# Patient Record
Sex: Female | Born: 1965 | Race: White | Hispanic: No | Marital: Married | Smoking: Never smoker
Health system: Southern US, Community
[De-identification: ages and names within clinical notes are randomized; demographics above are authoritative.]

## PROBLEM LIST (undated history)

## (undated) DIAGNOSIS — R55 Syncope and collapse: Secondary | ICD-10-CM

## (undated) DIAGNOSIS — L920 Granuloma annulare: Secondary | ICD-10-CM

## (undated) HISTORY — DX: Syncope and collapse: R55

## (undated) HISTORY — PX: TONSILLECTOMY: SUR1361

## (undated) HISTORY — DX: Granuloma annulare: L92.0

## (undated) HISTORY — PX: ABDOMINOPLASTY: SUR9

---
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
## 1992-07-16 HISTORY — PX: ABDOMINAL HYSTERECTOMY: SHX81

## 2015-03-27 ENCOUNTER — Encounter: Payer: Self-pay | Admitting: Family Medicine

## 2015-03-27 ENCOUNTER — Ambulatory Visit (INDEPENDENT_AMBULATORY_CARE_PROVIDER_SITE_OTHER): Payer: 59 | Admitting: Family Medicine

## 2015-03-27 ENCOUNTER — Other Ambulatory Visit: Payer: Self-pay | Admitting: Family Medicine

## 2015-03-27 ENCOUNTER — Ambulatory Visit: Payer: Self-pay | Admitting: Family Medicine

## 2015-03-27 VITALS — BP 100/66 | HR 69 | Temp 98.4°F | Resp 14 | Ht 63.5 in | Wt 201.4 lb

## 2015-03-27 DIAGNOSIS — R8290 Unspecified abnormal findings in urine: Secondary | ICD-10-CM

## 2015-03-27 DIAGNOSIS — Z1231 Encounter for screening mammogram for malignant neoplasm of breast: Secondary | ICD-10-CM

## 2015-03-27 DIAGNOSIS — Z0000 Encounter for general adult medical examination without abnormal findings: Secondary | ICD-10-CM

## 2015-03-27 LAB — POCT URINALYSIS DIPSTICK
Glucose, UA: NEGATIVE
Leukocytes, UA: NEGATIVE
NITRITE UA: NEGATIVE
RBC UA: NEGATIVE
UROBILINOGEN UA: 0.2
pH, UA: 6

## 2015-03-27 NOTE — Addendum Note (Signed)
Addended by: Eustace QuailEABOLD, Shariyah Eland J on: 03/27/2015 04:34 PM   Modules accepted: Orders

## 2015-03-27 NOTE — Patient Instructions (Signed)
Preventive Care for Adults, Female A healthy lifestyle and preventive care can promote health and wellness. Preventive health guidelines for women include the following key practices.  A routine yearly physical is a good way to check with your health care provider about your health and preventive screening. It is a chance to share any concerns and updates on your health and to receive a thorough exam.  Visit your dentist for a routine exam and preventive care every 6 months. Brush your teeth twice a day and floss once a day. Good oral hygiene prevents tooth decay and gum disease.  The frequency of eye exams is based on your age, health, family medical history, use of contact lenses, and other factors. Follow your health care provider's recommendations for frequency of eye exams.  Eat a healthy diet. Foods like vegetables, fruits, whole grains, low-fat dairy products, and lean protein foods contain the nutrients you need without too many calories. Decrease your intake of foods high in solid fats, added sugars, and salt. Eat the right amount of calories for you.Get information about a proper diet from your health care provider, if necessary.  Regular physical exercise is one of the most important things you can do for your health. Most adults should get at least 150 minutes of moderate-intensity exercise (any activity that increases your heart rate and causes you to sweat) each week. In addition, most adults need muscle-strengthening exercises on 2 or more days a week.  Maintain a healthy weight. The body mass index (BMI) is a screening tool to identify possible weight problems. It provides an estimate of body fat based on height and weight. Your health care provider can find your BMI and can help you achieve or maintain a healthy weight.For adults 20 years and older:  A BMI below 18.5 is considered underweight.  A BMI of 18.5 to 24.9 is normal.  A BMI of 25 to 29.9 is considered overweight.  A  BMI of 30 and above is considered obese.  Maintain normal blood lipids and cholesterol levels by exercising and minimizing your intake of saturated fat. Eat a balanced diet with plenty of fruit and vegetables. Blood tests for lipids and cholesterol should begin at age 45 and be repeated every 5 years. If your lipid or cholesterol levels are high, you are over 50, or you are at high risk for heart disease, you may need your cholesterol levels checked more frequently.Ongoing high lipid and cholesterol levels should be treated with medicines if diet and exercise are not working.  If you smoke, find out from your health care provider how to quit. If you do not use tobacco, do not start.  Lung cancer screening is recommended for adults aged 45-80 years who are at high risk for developing lung cancer because of a history of smoking. A yearly low-dose CT scan of the lungs is recommended for people who have at least a 30-pack-year history of smoking and are a current smoker or have quit within the past 15 years. A pack year of smoking is smoking an average of 1 pack of cigarettes a day for 1 year (for example: 1 pack a day for 30 years or 2 packs a day for 15 years). Yearly screening should continue until the smoker has stopped smoking for at least 15 years. Yearly screening should be stopped for people who develop a health problem that would prevent them from having lung cancer treatment.  If you are pregnant, do not drink alcohol. If you are  breastfeeding, be very cautious about drinking alcohol. If you are not pregnant and choose to drink alcohol, do not have more than 1 drink per day. One drink is considered to be 12 ounces (355 mL) of beer, 5 ounces (148 mL) of wine, or 1.5 ounces (44 mL) of liquor.  Avoid use of street drugs. Do not share needles with anyone. Ask for help if you need support or instructions about stopping the use of drugs.  High blood pressure causes heart disease and increases the risk  of stroke. Your blood pressure should be checked at least every 1 to 2 years. Ongoing high blood pressure should be treated with medicines if weight loss and exercise do not work.  If you are 55-79 years old, ask your health care provider if you should take aspirin to prevent strokes.  Diabetes screening is done by taking a blood sample to check your blood glucose level after you have not eaten for a certain period of time (fasting). If you are not overweight and you do not have risk factors for diabetes, you should be screened once every 3 years starting at age 45. If you are overweight or obese and you are 40-70 years of age, you should be screened for diabetes every year as part of your cardiovascular risk assessment.  Breast cancer screening is essential preventive care for women. You should practice "breast self-awareness." This means understanding the normal appearance and feel of your breasts and may include breast self-examination. Any changes detected, no matter how small, should be reported to a health care provider. Women in their 20s and 30s should have a clinical breast exam (CBE) by a health care provider as part of a regular health exam every 1 to 3 years. After age 40, women should have a CBE every year. Starting at age 40, women should consider having a mammogram (breast X-ray test) every year. Women who have a family history of breast cancer should talk to their health care provider about genetic screening. Women at a high risk of breast cancer should talk to their health care providers about having an MRI and a mammogram every year.  Breast cancer gene (BRCA)-related cancer risk assessment is recommended for women who have family members with BRCA-related cancers. BRCA-related cancers include breast, ovarian, tubal, and peritoneal cancers. Having family members with these cancers may be associated with an increased risk for harmful changes (mutations) in the breast cancer genes BRCA1 and  BRCA2. Results of the assessment will determine the need for genetic counseling and BRCA1 and BRCA2 testing.  Your health care provider may recommend that you be screened regularly for cancer of the pelvic organs (ovaries, uterus, and vagina). This screening involves a pelvic examination, including checking for microscopic changes to the surface of your cervix (Pap test). You may be encouraged to have this screening done every 3 years, beginning at age 21.  For women ages 30-65, health care providers may recommend pelvic exams and Pap testing every 3 years, or they may recommend the Pap and pelvic exam, combined with testing for human papilloma virus (HPV), every 5 years. Some types of HPV increase your risk of cervical cancer. Testing for HPV may also be done on women of any age with unclear Pap test results.  Other health care providers may not recommend any screening for nonpregnant women who are considered low risk for pelvic cancer and who do not have symptoms. Ask your health care provider if a screening pelvic exam is right for   you.  If you have had past treatment for cervical cancer or a condition that could lead to cancer, you need Pap tests and screening for cancer for at least 20 years after your treatment. If Pap tests have been discontinued, your risk factors (such as having a new sexual partner) need to be reassessed to determine if screening should resume. Some women have medical problems that increase the chance of getting cervical cancer. In these cases, your health care provider may recommend more frequent screening and Pap tests.  Colorectal cancer can be detected and often prevented. Most routine colorectal cancer screening begins at the age of 50 years and continues through age 75 years. However, your health care provider may recommend screening at an earlier age if you have risk factors for colon cancer. On a yearly basis, your health care provider may provide home test kits to check  for hidden blood in the stool. Use of a small camera at the end of a tube, to directly examine the colon (sigmoidoscopy or colonoscopy), can detect the earliest forms of colorectal cancer. Talk to your health care provider about this at age 50, when routine screening begins. Direct exam of the colon should be repeated every 5-10 years through age 75 years, unless early forms of precancerous polyps or small growths are found.  People who are at an increased risk for hepatitis B should be screened for this virus. You are considered at high risk for hepatitis B if:  You were born in a country where hepatitis B occurs often. Talk with your health care provider about which countries are considered high risk.  Your parents were born in a high-risk country and you have not received a shot to protect against hepatitis B (hepatitis B vaccine).  You have HIV or AIDS.  You use needles to inject street drugs.  You live with, or have sex with, someone who has hepatitis B.  You get hemodialysis treatment.  You take certain medicines for conditions like cancer, organ transplantation, and autoimmune conditions.  Hepatitis C blood testing is recommended for all people born from 1945 through 1965 and any individual with known risks for hepatitis C.  Practice safe sex. Use condoms and avoid high-risk sexual practices to reduce the spread of sexually transmitted infections (STIs). STIs include gonorrhea, chlamydia, syphilis, trichomonas, herpes, HPV, and human immunodeficiency virus (HIV). Herpes, HIV, and HPV are viral illnesses that have no cure. They can result in disability, cancer, and death.  You should be screened for sexually transmitted illnesses (STIs) including gonorrhea and chlamydia if:  You are sexually active and are younger than 24 years.  You are older than 24 years and your health care provider tells you that you are at risk for this type of infection.  Your sexual activity has changed  since you were last screened and you are at an increased risk for chlamydia or gonorrhea. Ask your health care provider if you are at risk.  If you are at risk of being infected with HIV, it is recommended that you take a prescription medicine daily to prevent HIV infection. This is called preexposure prophylaxis (PrEP). You are considered at risk if:  You are sexually active and do not regularly use condoms or know the HIV status of your partner(s).  You take drugs by injection.  You are sexually active with a partner who has HIV.  Talk with your health care provider about whether you are at high risk of being infected with HIV. If   you choose to begin PrEP, you should first be tested for HIV. You should then be tested every 3 months for as long as you are taking PrEP.  Osteoporosis is a disease in which the bones lose minerals and strength with aging. This can result in serious bone fractures or breaks. The risk of osteoporosis can be identified using a bone density scan. Women ages 67 years and over and women at risk for fractures or osteoporosis should discuss screening with their health care providers. Ask your health care provider whether you should take a calcium supplement or vitamin D to reduce the rate of osteoporosis.  Menopause can be associated with physical symptoms and risks. Hormone replacement therapy is available to decrease symptoms and risks. You should talk to your health care provider about whether hormone replacement therapy is right for you.  Use sunscreen. Apply sunscreen liberally and repeatedly throughout the day. You should seek shade when your shadow is shorter than you. Protect yourself by wearing long sleeves, pants, a wide-brimmed hat, and sunglasses year round, whenever you are outdoors.  Once a month, do a whole body skin exam, using a mirror to look at the skin on your back. Tell your health care provider of new moles, moles that have irregular borders, moles that  are larger than a pencil eraser, or moles that have changed in shape or color.  Stay current with required vaccines (immunizations).  Influenza vaccine. All adults should be immunized every year.  Tetanus, diphtheria, and acellular pertussis (Td, Tdap) vaccine. Pregnant women should receive 1 dose of Tdap vaccine during each pregnancy. The dose should be obtained regardless of the length of time since the last dose. Immunization is preferred during the 27th-36th week of gestation. An adult who has not previously received Tdap or who does not know her vaccine status should receive 1 dose of Tdap. This initial dose should be followed by tetanus and diphtheria toxoids (Td) booster doses every 10 years. Adults with an unknown or incomplete history of completing a 3-dose immunization series with Td-containing vaccines should begin or complete a primary immunization series including a Tdap dose. Adults should receive a Td booster every 10 years.  Varicella vaccine. An adult without evidence of immunity to varicella should receive 2 doses or a second dose if she has previously received 1 dose. Pregnant females who do not have evidence of immunity should receive the first dose after pregnancy. This first dose should be obtained before leaving the health care facility. The second dose should be obtained 4-8 weeks after the first dose.  Human papillomavirus (HPV) vaccine. Females aged 13-26 years who have not received the vaccine previously should obtain the 3-dose series. The vaccine is not recommended for use in pregnant females. However, pregnancy testing is not needed before receiving a dose. If a female is found to be pregnant after receiving a dose, no treatment is needed. In that case, the remaining doses should be delayed until after the pregnancy. Immunization is recommended for any person with an immunocompromised condition through the age of 61 years if she did not get any or all doses earlier. During the  3-dose series, the second dose should be obtained 4-8 weeks after the first dose. The third dose should be obtained 24 weeks after the first dose and 16 weeks after the second dose.  Zoster vaccine. One dose is recommended for adults aged 30 years or older unless certain conditions are present.  Measles, mumps, and rubella (MMR) vaccine. Adults born  before 1957 generally are considered immune to measles and mumps. Adults born in 1957 or later should have 1 or more doses of MMR vaccine unless there is a contraindication to the vaccine or there is laboratory evidence of immunity to each of the three diseases. A routine second dose of MMR vaccine should be obtained at least 28 days after the first dose for students attending postsecondary schools, health care workers, or international travelers. People who received inactivated measles vaccine or an unknown type of measles vaccine during 1963-1967 should receive 2 doses of MMR vaccine. People who received inactivated mumps vaccine or an unknown type of mumps vaccine before 1979 and are at high risk for mumps infection should consider immunization with 2 doses of MMR vaccine. For females of childbearing age, rubella immunity should be determined. If there is no evidence of immunity, females who are not pregnant should be vaccinated. If there is no evidence of immunity, females who are pregnant should delay immunization until after pregnancy. Unvaccinated health care workers born before 1957 who lack laboratory evidence of measles, mumps, or rubella immunity or laboratory confirmation of disease should consider measles and mumps immunization with 2 doses of MMR vaccine or rubella immunization with 1 dose of MMR vaccine.  Pneumococcal 13-valent conjugate (PCV13) vaccine. When indicated, a person who is uncertain of his immunization history and has no record of immunization should receive the PCV13 vaccine. All adults 65 years of age and older should receive this  vaccine. An adult aged 19 years or older who has certain medical conditions and has not been previously immunized should receive 1 dose of PCV13 vaccine. This PCV13 should be followed with a dose of pneumococcal polysaccharide (PPSV23) vaccine. Adults who are at high risk for pneumococcal disease should obtain the PPSV23 vaccine at least 8 weeks after the dose of PCV13 vaccine. Adults older than 52 years of age who have normal immune system function should obtain the PPSV23 vaccine dose at least 1 year after the dose of PCV13 vaccine.  Pneumococcal polysaccharide (PPSV23) vaccine. When PCV13 is also indicated, PCV13 should be obtained first. All adults aged 65 years and older should be immunized. An adult younger than age 65 years who has certain medical conditions should be immunized. Any person who resides in a nursing home or long-term care facility should be immunized. An adult smoker should be immunized. People with an immunocompromised condition and certain other conditions should receive both PCV13 and PPSV23 vaccines. People with human immunodeficiency virus (HIV) infection should be immunized as soon as possible after diagnosis. Immunization during chemotherapy or radiation therapy should be avoided. Routine use of PPSV23 vaccine is not recommended for American Indians, [place] Natives, or people younger than 65 years unless there are medical conditions that require PPSV23 vaccine. When indicated, people who have unknown immunization and have no record of immunization should receive PPSV23 vaccine. One-time revaccination 5 years after the first dose of PPSV23 is recommended for people aged 19-64 years who have chronic kidney failure, nephrotic syndrome, asplenia, or immunocompromised conditions. People who received 1-2 doses of PPSV23 before age 65 years should receive another dose of PPSV23 vaccine at age 65 years or later if at least 5 years have passed since the previous dose. Doses of PPSV23 are not  needed for people immunized with PPSV23 at or after age 65 years.  Meningococcal vaccine. Adults with asplenia or persistent complement component deficiencies should receive 2 doses of quadrivalent meningococcal conjugate (MenACWY-D) vaccine. The doses should be obtained   at least 2 months apart. Microbiologists working with certain meningococcal bacteria, [place] recruits, people at risk during an outbreak, and people who travel to or live in countries with a high rate of meningitis should be immunized. A first-year college student up through age 34 years who is living in a residence hall should receive a dose if she did not receive a dose on or after her 16th birthday. Adults who have certain high-risk conditions should receive one or more doses of vaccine.  Hepatitis A vaccine. Adults who wish to be protected from this disease, have certain high-risk conditions, work with hepatitis A-infected animals, work in hepatitis A research labs, or travel to or work in countries with a high rate of hepatitis A should be immunized. Adults who were previously unvaccinated and who anticipate close contact with an international adoptee during the first 60 days after arrival in the [place] from a country with a high rate of hepatitis A should be immunized.  Hepatitis B vaccine. Adults who wish to be protected from this disease, have certain high-risk conditions, may be exposed to blood or other infectious body fluids, are household contacts or sex partners of hepatitis B positive people, are clients or workers in certain care facilities, or travel to or work in countries with a high rate of hepatitis B should be immunized.  Haemophilus influenzae type b (Hib) vaccine. A previously unvaccinated person with asplenia or sickle cell disease or having a scheduled splenectomy should receive 1 dose of Hib vaccine. Regardless of previous immunization, a recipient of a hematopoietic stem cell transplant should receive a  3-dose series 6-12 months after her successful transplant. Hib vaccine is not recommended for adults with HIV infection. Preventive Services / Frequency Ages 35 to 4 years  Blood pressure check.** / Every 3-5 years.  Lipid and cholesterol check.** / Every 5 years beginning at age 60.  Clinical breast exam.** / Every 3 years for women in their 71s and 10s.  BRCA-related cancer risk assessment.** / For women who have family members with a BRCA-related cancer (breast, ovarian, tubal, or peritoneal cancers).  Pap test.** / Every 2 years from ages 76 through 26. Every 3 years starting at age 61 through age 76 or 93 with a history of 3 consecutive normal Pap tests.  HPV screening.** / Every 3 years from ages 37 through ages 60 to 51 with a history of 3 consecutive normal Pap tests.  Hepatitis C blood test.** / For any individual with known risks for hepatitis C.  Skin self-exam. / Monthly.  Influenza vaccine. / Every year.  Tetanus, diphtheria, and acellular pertussis (Tdap, Td) vaccine.** / Consult your health care provider. Pregnant women should receive 1 dose of Tdap vaccine during each pregnancy. 1 dose of Td every 10 years.  Varicella vaccine.** / Consult your health care provider. Pregnant females who do not have evidence of immunity should receive the first dose after pregnancy.  HPV vaccine. / 3 doses over 6 months, if 93 and younger. The vaccine is not recommended for use in pregnant females. However, pregnancy testing is not needed before receiving a dose.  Measles, mumps, rubella (MMR) vaccine.** / You need at least 1 dose of MMR if you were born in 1957 or later. You may also need a 2nd dose. For females of childbearing age, rubella immunity should be determined. If there is no evidence of immunity, females who are not pregnant should be vaccinated. If there is no evidence of immunity, females who are  pregnant should delay immunization until after pregnancy.  Pneumococcal  13-valent conjugate (PCV13) vaccine.** / Consult your health care provider.  Pneumococcal polysaccharide (PPSV23) vaccine.** / 1 to 2 doses if you smoke cigarettes or if you have certain conditions.  Meningococcal vaccine.** / 1 dose if you are age 68 to 8 years and a Market researcher living in a residence hall, or have one of several medical conditions, you need to get vaccinated against meningococcal disease. You may also need additional booster doses.  Hepatitis A vaccine.** / Consult your health care provider.  Hepatitis B vaccine.** / Consult your health care provider.  Haemophilus influenzae type b (Hib) vaccine.** / Consult your health care provider. Ages 7 to 53 years  Blood pressure check.** / Every year.  Lipid and cholesterol check.** / Every 5 years beginning at age 25 years.  Lung cancer screening. / Every year if you are aged 11-80 years and have a 30-pack-year history of smoking and currently smoke or have quit within the past 15 years. Yearly screening is stopped once you have quit smoking for at least 15 years or develop a health problem that would prevent you from having lung cancer treatment.  Clinical breast exam.** / Every year after age 48 years.  BRCA-related cancer risk assessment.** / For women who have family members with a BRCA-related cancer (breast, ovarian, tubal, or peritoneal cancers).  Mammogram.** / Every year beginning at age 41 years and continuing for as long as you are in good health. Consult with your health care provider.  Pap test.** / Every 3 years starting at age 65 years through age 37 or 70 years with a history of 3 consecutive normal Pap tests.  HPV screening.** / Every 3 years from ages 72 years through ages 60 to 40 years with a history of 3 consecutive normal Pap tests.  Fecal occult blood test (FOBT) of stool. / Every year beginning at age 21 years and continuing until age 5 years. You may not need to do this test if you get  a colonoscopy every 10 years.  Flexible sigmoidoscopy or colonoscopy.** / Every 5 years for a flexible sigmoidoscopy or every 10 years for a colonoscopy beginning at age 35 years and continuing until age 48 years.  Hepatitis C blood test.** / For all people born from [birth year] through 1965 and any individual with known risks for hepatitis C.  Skin self-exam. / Monthly.  Influenza vaccine. / Every year.  Tetanus, diphtheria, and acellular pertussis (Tdap/Td) vaccine.** / Consult your health care provider. Pregnant women should receive 1 dose of Tdap vaccine during each pregnancy. 1 dose of Td every 10 years.  Varicella vaccine.** / Consult your health care provider. Pregnant females who do not have evidence of immunity should receive the first dose after pregnancy.  Zoster vaccine.** / 1 dose for adults aged 30 years or older.  Measles, mumps, rubella (MMR) vaccine.** / You need at least 1 dose of MMR if you were born in 1957 or later. You may also need a second dose. For females of childbearing age, rubella immunity should be determined. If there is no evidence of immunity, females who are not pregnant should be vaccinated. If there is no evidence of immunity, females who are pregnant should delay immunization until after pregnancy.  Pneumococcal 13-valent conjugate (PCV13) vaccine.** / Consult your health care provider.  Pneumococcal polysaccharide (PPSV23) vaccine.** / 1 to 2 doses if you smoke cigarettes or if you have certain conditions.  Meningococcal vaccine.** /  Consult your health care provider.  Hepatitis A vaccine.** / Consult your health care provider.  Hepatitis B vaccine.** / Consult your health care provider.  Haemophilus influenzae type b (Hib) vaccine.** / Consult your health care provider. Ages 64 years and over  Blood pressure check.** / Every year.  Lipid and cholesterol check.** / Every 5 years beginning at age 23 years.  Lung cancer screening. / Every year if you  are aged 16-80 years and have a 30-pack-year history of smoking and currently smoke or have quit within the past 15 years. Yearly screening is stopped once you have quit smoking for at least 15 years or develop a health problem that would prevent you from having lung cancer treatment.  Clinical breast exam.** / Every year after age 74 years.  BRCA-related cancer risk assessment.** / For women who have family members with a BRCA-related cancer (breast, ovarian, tubal, or peritoneal cancers).  Mammogram.** / Every year beginning at age 44 years and continuing for as long as you are in good health. Consult with your health care provider.  Pap test.** / Every 3 years starting at age 58 years through age 22 or 39 years with 3 consecutive normal Pap tests. Testing can be stopped between 65 and 70 years with 3 consecutive normal Pap tests and no abnormal Pap or HPV tests in the past 10 years.  HPV screening.** / Every 3 years from ages 64 years through ages 70 or 61 years with a history of 3 consecutive normal Pap tests. Testing can be stopped between 65 and 70 years with 3 consecutive normal Pap tests and no abnormal Pap or HPV tests in the past 10 years.  Fecal occult blood test (FOBT) of stool. / Every year beginning at age 40 years and continuing until age 27 years. You may not need to do this test if you get a colonoscopy every 10 years.  Flexible sigmoidoscopy or colonoscopy.** / Every 5 years for a flexible sigmoidoscopy or every 10 years for a colonoscopy beginning at age 7 years and continuing until age 32 years.  Hepatitis C blood test.** / For all people born from [birth year] through 1965 and any individual with known risks for hepatitis C.  Osteoporosis screening.** / A one-time screening for women ages 30 years and over and women at risk for fractures or osteoporosis.  Skin self-exam. / Monthly.  Influenza vaccine. / Every year.  Tetanus, diphtheria, and acellular pertussis (Tdap/Td)  vaccine.** / 1 dose of Td every 10 years.  Varicella vaccine.** / Consult your health care provider.  Zoster vaccine.** / 1 dose for adults aged 35 years or older.  Pneumococcal 13-valent conjugate (PCV13) vaccine.** / Consult your health care provider.  Pneumococcal polysaccharide (PPSV23) vaccine.** / 1 dose for all adults aged 46 years and older.  Meningococcal vaccine.** / Consult your health care provider.  Hepatitis A vaccine.** / Consult your health care provider.  Hepatitis B vaccine.** / Consult your health care provider.  Haemophilus influenzae type b (Hib) vaccine.** / Consult your health care provider. ** Family history and personal history of risk and conditions may change your health care provider's recommendations.   This information is not intended to replace advice given to you by your health care provider. Make sure you discuss any questions you have with your health care provider.   Document Released: 04/30/2001 Document Revised: 03/25/2014 Document Reviewed: 07/30/2010 Elsevier Interactive Patient Education [employer].

## 2015-03-27 NOTE — Progress Notes (Signed)
Subjective:     Julie Gibbs is a 50 y.o. female and is here for a comprehensive physical exam. The patient reports no problems.  Social History   Social History  . Marital Status: Married    Spouse Name: N/A  . Number of Children: N/A  . Years of Education: N/A   Occupational History  . Not on file.   Social History Main Topics  . Smoking status: Never Smoker   . Smokeless tobacco: Not on file  . Alcohol Use: 0.6 - 1.2 oz/week    1-2 Glasses of wine per week  . Drug Use: Not on file  . Sexual Activity: Yes   Other Topics Concern  . Not on file   Social History Narrative  . No narrative on file   Health Maintenance  Topic Date Due  . Hepatitis C Screening  1966/02/01  . HIV Screening  09/05/1978  . TETANUS/TDAP  09/05/1982  . COLONOSCOPY  09/04/2013  . INFLUENZA VACCINE  06/16/2015 (Originally 10/17/2014)  . MAMMOGRAM  09/24/2015  . PAP SMEAR  09/23/2016    The following portions of the patient's history were reviewed and updated as appropriate:  She  has a past medical history of Granuloma annulare. She  does not have a problem list on file. She  has past surgical history that includes Abdominal hysterectomy (07/1992); Tonsillectomy (Kindergarten); and Abdominoplasty ("About 5 years ago"). Her family history includes Ataxia in her mother; Cancer in her father; Coronary artery disease in her paternal grandfather; Heart disease in her paternal grandmother; Heart failure in her paternal grandmother; Mental retardation in her maternal grandfather; Other in her mother; Suicidality in her paternal grandfather. She  reports that she has never smoked. She does not have any smokeless tobacco history on file. She reports that she drinks about 0.6 - 1.2 oz of alcohol per week. Her drug history is not on file. She has a current medication list which includes the following prescription(s): vitamin d3, clobetasol ointment, NON FORMULARY, and triamcinolone cream. No current outpatient  prescriptions on file prior to visit.   No current facility-administered medications on file prior to visit.   She has No Known Allergies..  Review of Systems Review of Systems  Constitutional: Negative for activity change, appetite change and fatigue.  HENT: Negative for hearing loss, congestion, tinnitus and ear discharge.  dentist q40mEyes: Negative for visual disturbance (see optho q1y -- vision corrected to 20/20 with glasses).  Respiratory: Negative for cough, chest tightness and shortness of breath.   Cardiovascular: Negative for chest pain, palpitations and leg swelling.  Gastrointestinal: Negative for abdominal pain, diarrhea, constipation and abdominal distention.  Genitourinary: Negative for urgency, frequency, decreased urine volume and difficulty urinating.  Musculoskeletal: Negative for back pain, arthralgias and gait problem.  Skin: Negative for color change, pallor and rash.  Neurological: Negative for dizziness, light-headedness, numbness and headaches.  Hematological: Negative for adenopathy. Does not bruise/bleed easily.  Psychiatric/Behavioral: Negative for suicidal ideas, confusion, sleep disturbance, self-injury, dysphoric mood, decreased concentration and agitation.       Objective:    BP 100/66 mmHg  Pulse 69  Temp(Src) 98.4 F (36.9 C) (Oral)  Resp 14  Ht 5' 3.5" (1.613 m)  Wt 201 lb 6.4 oz (91.354 kg)  BMI 35.11 kg/m2  SpO2 99% General appearance: alert, cooperative, appears stated age and no distress Head: Normocephalic, without obvious abnormality, atraumatic Eyes: conjunctivae/corneas clear. PERRL, EOM's intact. Fundi benign. Ears: normal TM's and external ear canals both ears Nose: Nares normal.  Septum midline. Mucosa normal. No drainage or sinus tenderness. Throat: lips, mucosa, and tongue normal; teeth and gums normal Neck: no adenopathy, no carotid bruit, supple, symmetrical, trachea midline and thyroid not enlarged, symmetric, no  tenderness/mass/nodules Back: symmetric, no curvature. ROM normal. No CVA tenderness. Lungs: clear to auscultation bilaterally Breasts: normal appearance, no masses or tenderness Heart: regular rate and rhythm, S1, S2 normal, no murmur, click, rub or gallop Abdomen: soft, non-tender; bowel sounds normal; no masses,  no organomegaly Pelvic: deferred Extremities: extremities normal, atraumatic, no cyanosis or edema Pulses: 2+ and symmetric Skin: Skin color, texture, turgor normal. No rashes or lesions Lymph nodes: Cervical, supraclavicular, and axillary nodes normal. Neurologic: Alert and oriented X 3, normal strength and tone. Normal symmetric reflexes. Normal coordination and gait Psych- no depression, no anxiety      Assessment:    Healthy female exam.      Plan:    ghm utd Check labs See After Visit Summary for Counseling Recommendations   1. Preventative health care   - Ambulatory referral to Gastroenterology - Comp Met (CMET) - CBC with Differential/Platelet - Lipid panel - POCT urinalysis dipstick - TSH - HIV antibody - Hepatitis C antibody

## 2015-03-27 NOTE — Progress Notes (Signed)
Pre visit review using our clinic review tool, if applicable. No additional management support is needed unless otherwise documented below in the visit note. 

## 2015-03-27 NOTE — Progress Notes (Deleted)
   Subjective:    Patient ID: Julie Gibbs, female    DOB: 10-Dec-1965, 50 y.o.   MRN: [id number]  HPI    Review of Systems     Objective:   Physical Exam        Assessment & Plan:

## 2015-03-28 ENCOUNTER — Encounter: Payer: Self-pay | Admitting: Family Medicine

## 2015-03-28 ENCOUNTER — Ambulatory Visit (HOSPITAL_BASED_OUTPATIENT_CLINIC_OR_DEPARTMENT_OTHER)
Admission: RE | Admit: 2015-03-28 | Discharge: 2015-03-28 | Disposition: A | Discharge status: Home / Self Care | Payer: 59 | Source: Ambulatory Visit | Attending: Family Medicine | Admitting: Family Medicine

## 2015-03-28 DIAGNOSIS — Z1231 Encounter for screening mammogram for malignant neoplasm of breast: Secondary | ICD-10-CM | POA: Diagnosis present

## 2015-03-28 LAB — COMPREHENSIVE METABOLIC PANEL
ALBUMIN: 4.5 g/dL (ref 3.5–5.2)
ALT: 14 U/L (ref 0–35)
AST: 16 U/L (ref 0–37)
Alkaline Phosphatase: 66 U/L (ref 39–117)
BUN: 19 mg/dL (ref 6–23)
CHLORIDE: 103 meq/L (ref 96–112)
CO2: 26 meq/L (ref 19–32)
CREATININE: 0.92 mg/dL (ref 0.40–1.20)
Calcium: 8.9 mg/dL (ref 8.4–10.5)
GFR: 68.25 mL/min (ref >60.00)
Glucose, Bld: 84 mg/dL (ref 70–99)
POTASSIUM: 4.3 meq/L (ref 3.5–5.1)
SODIUM: 138 meq/L (ref 135–145)
Total Bilirubin: 0.5 mg/dL (ref 0.2–1.2)
Total Protein: 7.3 g/dL (ref 6.0–8.3)

## 2015-03-28 LAB — LIPID PANEL
CHOLESTEROL: 182 mg/dL (ref 0–200)
HDL: 50 mg/dL (ref >39.00)
LDL CALC: 111 mg/dL — AB (ref 0–99)
NonHDL: 132.41
TRIGLYCERIDES: 105 mg/dL (ref 0.0–149.0)
Total CHOL/HDL Ratio: 4
VLDL: 21 mg/dL (ref 0.0–40.0)

## 2015-03-28 LAB — CBC WITH DIFFERENTIAL/PLATELET
Basophils Absolute: 0 10*3/uL (ref 0.0–0.1)
Basophils Relative: 0.5 % (ref 0.0–3.0)
EOS ABS: 0.1 10*3/uL (ref 0.0–0.7)
EOS PCT: 0.8 % (ref 0.0–5.0)
HCT: 39.2 % (ref 36.0–46.0)
HEMOGLOBIN: 13 g/dL (ref 12.0–15.0)
LYMPHS PCT: 31.6 % (ref 12.0–46.0)
Lymphs Abs: 2.8 10*3/uL (ref 0.7–4.0)
MCHC: 33.2 g/dL (ref 30.0–36.0)
MCV: 95.6 fl (ref 78.0–100.0)
MONO ABS: 0.4 10*3/uL (ref 0.1–1.0)
Monocytes Relative: 4.5 % (ref 3.0–12.0)
Neutro Abs: 5.6 10*3/uL (ref 1.4–7.7)
Neutrophils Relative %: 62.6 % (ref 43.0–77.0)
Platelets: 357 10*3/uL (ref 150.0–400.0)
RBC: 4.11 Mil/uL (ref 3.87–5.11)
RDW: 12.3 % (ref 11.5–15.5)
WBC: 9 10*3/uL (ref 4.0–10.5)

## 2015-03-28 LAB — HEPATITIS C ANTIBODY: HCV AB: NEGATIVE

## 2015-03-28 LAB — TSH: TSH: 1.53 u[IU]/mL (ref 0.35–4.50)

## 2015-03-28 LAB — HIV ANTIBODY (ROUTINE TESTING W REFLEX): HIV 1&2 Ab, 4th Generation: NONREACTIVE

## 2015-03-29 LAB — URINE CULTURE: Colony Count: 15000

## 2015-03-30 ENCOUNTER — Encounter: Payer: Self-pay | Admitting: Gastroenterology

## 2015-04-04 ENCOUNTER — Encounter: Payer: Self-pay | Admitting: Family Medicine

## 2015-05-26 ENCOUNTER — Encounter: Payer: 59 | Admitting: Gastroenterology

## 2015-06-13 ENCOUNTER — Ambulatory Visit (AMBULATORY_SURGERY_CENTER): Payer: Self-pay

## 2015-06-13 VITALS — Ht 62.0 in | Wt 201.0 lb

## 2015-06-13 DIAGNOSIS — Z1211 Encounter for screening for malignant neoplasm of colon: Secondary | ICD-10-CM

## 2015-06-13 MED ORDER — NA SULFATE-K SULFATE-MG SULF 17.5-3.13-1.6 GM/177ML PO SOLN: ORAL | Status: DC

## 2015-06-13 NOTE — Progress Notes (Signed)
Per pt, no allergies to soy or egg products.Pt not taking any weight loss meds or using  O2 at home. 

## 2015-06-27 ENCOUNTER — Ambulatory Visit (AMBULATORY_SURGERY_CENTER): Payer: 59 | Admitting: Gastroenterology

## 2015-06-27 ENCOUNTER — Encounter: Payer: Self-pay | Admitting: Gastroenterology

## 2015-06-27 VITALS — BP 108/69 | HR 58 | Temp 98.6°F | Resp 12 | Ht 62.0 in | Wt 201.0 lb

## 2015-06-27 DIAGNOSIS — Z1211 Encounter for screening for malignant neoplasm of colon: Secondary | ICD-10-CM

## 2015-06-27 MED ORDER — SODIUM CHLORIDE 0.9 % IV SOLN
500.0000 mL | INTRAVENOUS | Status: DC
Start: 2015-06-27 — End: 2015-06-27

## 2015-06-27 NOTE — Op Note (Signed)
[facility] Patient Name: Julie Gibbs Procedure Date: 06/27/2015 8:43 AM MRN: [id number] Endoscopist: Napoleon Form , MD Age: 50 Date of Birth: 08-15-1965 Gender: Female Procedure:                Colonoscopy Indications:              Screening for colorectal malignant neoplasm Medicines:                Monitored Anesthesia Care Procedure:                Pre-Anesthesia Assessment:                           - Prior to the procedure, a History and Physical                            was performed, and patient medications and                            allergies were reviewed. The patient's tolerance of                            previous anesthesia was also reviewed. The risks                            and benefits of the procedure and the sedation                            options and risks were discussed with the patient.                            All questions were answered, and informed consent                            was obtained. Prior Anticoagulants: The patient has                            taken no previous anticoagulant or antiplatelet                            agents. ASA Grade Assessment: II - A patient with                            mild systemic disease. After reviewing the risks                            and benefits, the patient was deemed in                            satisfactory condition to undergo the procedure.                           After obtaining informed consent, the colonoscope  was passed under direct vision. Throughout the                            procedure, the patient's blood pressure, pulse, and                            oxygen saturations were monitored continuously. The                            Model CF-HQ190L [id number]) scope was introduced                            through the anus and advanced to the the terminal                            ileum, with identification of the appendiceal                          orifice and IC valve. The colonoscopy was performed                            without difficulty. The patient tolerated the                            procedure well. The quality of the bowel                            preparation was excellent. The terminal ileum,                            ileocecal valve, appendiceal orifice, and rectum                            were photographed. The quality of the bowel                            preparation was evaluated using the BBPS [facility]                            Bowel Preparation Scale) with scores of: Right                            Colon = 3, Transverse Colon = 3 and Left Colon = 3                            (entire mucosa seen well with no residual staining,                            small fragments of stool or opaque liquid). The                            total BBPS score equals 9. Scope In: 8:52:28 AM Scope Out: 9:06:15 AM Scope Withdrawal Time: 0 hours 4 minutes  35 seconds  Total Procedure Duration: 0 hours 13 minutes 47 seconds  Findings:                 The perianal and digital rectal examinations were                            normal.                           The colon (entire examined portion) appeared normal. Complications:            No immediate complications. Estimated Blood Loss:     Estimated blood loss: none. Impression:               - The entire examined colon is normal.                           - No specimens collected. Recommendation:           - Patient has a contact number available for                            emergencies. The signs and symptoms of potential                            delayed complications were discussed with the                            patient. Return to normal activities tomorrow.                            Written discharge instructions were provided to the                            patient.                           - Resume previous diet.                            - Continue present medications.                           - Repeat colonoscopy in 10 years for screening                            purposes.                           - Return to GI clinic PRN. Napoleon FormKavitha V. Jarryn Altland, MD 06/27/2015 9:09:43 AM This report has been signed electronically.

## 2015-06-27 NOTE — Patient Instructions (Signed)
YOU HAD AN ENDOSCOPIC PROCEDURE TODAY AT THE [facility]:   Refer to the procedure report that was given to you for any specific questions about what was found during the examination.  If the procedure report does not answer your questions, please call your gastroenterologist to clarify.  If you requested that your care partner not be given the details of your procedure findings, then the procedure report has been included in a sealed envelope for you to review at your convenience later.  YOU SHOULD EXPECT: Some feelings of bloating in the abdomen. Passage of more gas than usual.  Walking can help get rid of the air that was put into your GI tract during the procedure and reduce the bloating. If you had a lower endoscopy (such as a colonoscopy or flexible sigmoidoscopy) you may notice spotting of blood in your stool or on the toilet paper. If you underwent a bowel prep for your procedure, you may not have a normal bowel movement for a few days.  Please Note:  You might notice some irritation and congestion in your nose or some drainage.  This is from the oxygen used during your procedure.  There is no need for concern and it should clear up in a day or so.  SYMPTOMS TO REPORT IMMEDIATELY:   Following lower endoscopy (colonoscopy or flexible sigmoidoscopy):  Excessive amounts of blood in the stool  Significant tenderness or worsening of abdominal pains  Swelling of the abdomen that is new, acute  Fever of 100F or higher    For urgent or emergent issues, a gastroenterologist can be reached at any hour by calling [phone].   DIET: Your first meal following the procedure should be a small meal and then it is ok to progress to your normal diet. Heavy or fried foods are harder to digest and may make you feel nauseous or bloated.  Likewise, meals heavy in dairy and vegetables can increase bloating.  Drink plenty of fluids but you should avoid alcoholic beverages for 24  hours.  ACTIVITY:  You should plan to take it easy for the rest of today and you should NOT DRIVE or use heavy machinery until tomorrow (because of the sedation medicines used during the test).    FOLLOW UP: Our staff will call the number listed on your records the next business day following your procedure to check on you and address any questions or concerns that you may have regarding the information given to you following your procedure. If we do not reach you, we will leave a message.  However, if you are feeling well and you are not experiencing any problems, there is no need to return our call.  We will assume that you have returned to your regular daily activities without incident.  If any biopsies were taken you will be contacted by phone or by letter within the next 1-3 weeks.  Please call us at [phone] if you have not heard about the biopsies in 3 weeks.    SIGNATURES/CONFIDENTIALITY: You and/or your care partner have signed paperwork which will be entered into your electronic medical record.  These signatures attest to the fact that that the information above on your After Visit Summary has been reviewed and is understood.  Full responsibility of the confidentiality of this discharge information lies with you and/or your care-partner.   Resume medications. Information given on hemorrhoid banding.

## 2015-06-27 NOTE — Progress Notes (Signed)
Patient awakening,vss,report to rn 

## 2015-06-28 ENCOUNTER — Telehealth: Payer: Self-pay

## 2015-06-28 NOTE — Telephone Encounter (Signed)
  Follow up Call-  Call back number 06/27/2015  Post procedure Call Back phone  # [phone]  Permission to leave phone message Yes     Patient questions:  Do you have a fever, pain , or abdominal swelling? No. Pain Score  0 *  Have you tolerated food without any problems? Yes.    Have you been able to return to your normal activities? Yes.    Do you have any questions about your discharge instructions: Diet   No. Medications  No. Follow up visit  No.  Do you have questions or concerns about your Care? No.  Actions: * If pain score is 4 or above: No action needed, pain <4.

## 2016-03-21 ENCOUNTER — Other Ambulatory Visit: Payer: Self-pay | Admitting: Family Medicine

## 2016-03-21 DIAGNOSIS — Z1231 Encounter for screening mammogram for malignant neoplasm of breast: Secondary | ICD-10-CM

## 2016-03-28 ENCOUNTER — Ambulatory Visit (HOSPITAL_BASED_OUTPATIENT_CLINIC_OR_DEPARTMENT_OTHER)
Admission: RE | Admit: 2016-03-28 | Discharge: 2016-03-28 | Disposition: A | Discharge status: Home / Self Care | Payer: 59 | Source: Ambulatory Visit | Attending: Family Medicine | Admitting: Family Medicine

## 2016-03-28 DIAGNOSIS — Z1231 Encounter for screening mammogram for malignant neoplasm of breast: Secondary | ICD-10-CM

## 2016-04-01 ENCOUNTER — Other Ambulatory Visit (HOSPITAL_COMMUNITY)
Admission: RE | Admit: 2016-04-01 | Discharge: 2016-04-01 | Disposition: A | Discharge status: Home / Self Care | Payer: 59 | Source: Ambulatory Visit | Attending: Family Medicine | Admitting: Family Medicine

## 2016-04-01 ENCOUNTER — Ambulatory Visit (INDEPENDENT_AMBULATORY_CARE_PROVIDER_SITE_OTHER): Payer: 59 | Admitting: Family Medicine

## 2016-04-01 ENCOUNTER — Encounter: Payer: Self-pay | Admitting: Family Medicine

## 2016-04-01 VITALS — BP 102/60 | HR 68 | Temp 98.1°F | Resp 16 | Ht 62.0 in | Wt 198.0 lb

## 2016-04-01 DIAGNOSIS — Z01419 Encounter for gynecological examination (general) (routine) without abnormal findings: Secondary | ICD-10-CM | POA: Insufficient documentation

## 2016-04-01 DIAGNOSIS — Z0000 Encounter for general adult medical examination without abnormal findings: Secondary | ICD-10-CM | POA: Diagnosis not present

## 2016-04-01 DIAGNOSIS — Z1151 Encounter for screening for human papillomavirus (HPV): Secondary | ICD-10-CM | POA: Diagnosis present

## 2016-04-01 DIAGNOSIS — E2839 Other primary ovarian failure: Secondary | ICD-10-CM

## 2016-04-01 NOTE — Progress Notes (Signed)
Subjective:    Patient ID: Julie Gibbs, female    DOB: December 27, 1965, 51 y.o.   MRN: [id number]  Chief Complaint  Patient presents with  . Annual Exam    not fasting.     HPI Patient is in today for annual physical.  No complaints.   Past Medical History:  Diagnosis Date  . Blackout spell    per pt/sedation can cause drop in B/P and blackout spell  . 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 ABDOMINAL HYSTERECTOMY  07/1992  . ABDOMINOPLASTY  "About 5 years ago"  . TONSILLECTOMY  Kindergarten    Family History  Problem Relation Age of Onset  . Ataxia Mother   . Other Mother     Hereditary Vitamin E Deficiency  . Cancer Father     Neuroendocrine CA  . Mental retardation Maternal Grandfather   . Coronary artery disease Paternal Grandfather   . Suicidality Paternal Grandfather   . Heart disease Paternal Grandmother   . Heart failure Paternal Grandmother     Social History   Social History  . Marital status: Married    Spouse name: N/A  . Number of children: N/A  . Years of education: N/A   Occupational History  . Not on file.   Social History Main Topics  . Smoking status: Never Smoker  . Smokeless tobacco: Not on file  . Alcohol use 0.6 - 1.2 oz/week    1 - 2 Glasses of wine per week  . Drug use: No  . Sexual activity: Yes   Other Topics Concern  . Not on file   Social History Narrative  . No narrative on file    Outpatient Medications Prior to Visit  Medication Sig Dispense Refill  . Cholecalciferol (VITAMIN D3) 10000 units TABS Take 1 tablet by mouth daily. Reported on 06/27/2015    . clobetasol ointment (TEMOVATE) 0.05 % use prn  1  . NON FORMULARY 1 tablet daily. [employer] Vessel Care    . triamcinolone cream (KENALOG) 0.1 % prn  1   No facility-administered medications prior to visit.     No Known Allergies  ROS     Objective:    Physical Exam  BP 102/60 (BP  Location: Left Arm, Patient Position: Sitting, Cuff Size: Large)   Pulse 68   Temp 98.1 F (36.7 C) (Oral)   Resp 16   Ht 5\' 2"  (1.575 m)   Wt 198 lb (89.8 kg)   SpO2 98%   BMI 36.21 kg/m  Wt Readings from Last 3 Encounters:  04/01/16 198 lb (89.8 kg)  06/27/15 201 lb (91.2 kg)  06/13/15 201 lb (91.2 kg)     Lab Results  Component Value Date   WBC 8.3 04/01/2016   HGB 13.1 04/01/2016   HCT 38.3 04/01/2016   PLT 282.0 04/01/2016   GLUCOSE 80 04/01/2016   CHOL 157 04/01/2016   TRIG 65.0 04/01/2016   HDL 50.40 04/01/2016   LDLCALC 94 04/01/2016   ALT 10 04/01/2016   AST 13 04/01/2016   NA 137 04/01/2016   K 4.1 04/01/2016   CL 105 04/01/2016   CREATININE 0.93 04/01/2016   BUN 16 04/01/2016   CO2 26 04/01/2016   TSH 2.02 04/01/2016    Lab Results  Component Value Date   TSH 2.02 04/01/2016   Lab Results  Component Value Date   WBC 8.3 04/01/2016  HGB 13.1 04/01/2016   HCT 38.3 04/01/2016   MCV 94.8 04/01/2016   PLT 282.0 04/01/2016   Lab Results  Component Value Date   NA 137 04/01/2016   K 4.1 04/01/2016   CO2 26 04/01/2016   GLUCOSE 80 04/01/2016   BUN 16 04/01/2016   CREATININE 0.93 04/01/2016   BILITOT 0.4 04/01/2016   ALKPHOS 59 04/01/2016   AST 13 04/01/2016   ALT 10 04/01/2016   PROT 6.7 04/01/2016   ALBUMIN 4.1 04/01/2016   CALCIUM 8.9 04/01/2016   GFR 67.14 04/01/2016   Lab Results  Component Value Date   CHOL 157 04/01/2016   Lab Results  Component Value Date   HDL 50.40 04/01/2016   Lab Results  Component Value Date   LDLCALC 94 04/01/2016   Lab Results  Component Value Date   TRIG 65.0 04/01/2016   Lab Results  Component Value Date   CHOLHDL 3 04/01/2016   No results found for: HGBA1C     Assessment & Plan:   Problem List Items Addressed This Visit      Unprioritized   Preventative health care - Primary    ghm utd Check labs See avs       Relevant Orders   Lipid panel (Completed)   Comprehensive  metabolic panel (Completed)   TSH (Completed)   CBC with Differential/Platelet (Completed)   Urinalysis, Routine w reflex microscopic (Completed)   Cytology - PAP    Other Visit Diagnoses    Estrogen deficiency       Relevant Orders   DG Bone Density      I am having Julie Gibbs maintain her clobetasol ointment, triamcinolone cream, NON FORMULARY, and Vitamin D3.  No orders of the defined types were placed in this encounter.   CMA served as Neurosurgeonscribe during this visit. History, Physical and Plan performed by medical provider. Documentation and orders reviewed and attested to.   Donato SchultzYvonne R Lowne Chase, DO

## 2016-04-01 NOTE — Progress Notes (Signed)
Pre visit review using our clinic review tool, if applicable. No additional management support is needed unless otherwise documented below in the visit note. 

## 2016-04-01 NOTE — Patient Instructions (Signed)

## 2016-04-02 LAB — COMPREHENSIVE METABOLIC PANEL
ALBUMIN: 4.1 g/dL (ref 3.5–5.2)
ALK PHOS: 59 U/L (ref 39–117)
ALT: 10 U/L (ref 0–35)
AST: 13 U/L (ref 0–37)
BUN: 16 mg/dL (ref 6–23)
CHLORIDE: 105 meq/L (ref 96–112)
CO2: 26 mEq/L (ref 19–32)
Calcium: 8.9 mg/dL (ref 8.4–10.5)
Creatinine, Ser: 0.93 mg/dL (ref 0.40–1.20)
GFR: 67.14 mL/min (ref >60.00)
GLUCOSE: 80 mg/dL (ref 70–99)
POTASSIUM: 4.1 meq/L (ref 3.5–5.1)
SODIUM: 137 meq/L (ref 135–145)
TOTAL PROTEIN: 6.7 g/dL (ref 6.0–8.3)
Total Bilirubin: 0.4 mg/dL (ref 0.2–1.2)

## 2016-04-02 LAB — CBC WITH DIFFERENTIAL/PLATELET
Basophils Absolute: 0 10*3/uL (ref 0.0–0.1)
Basophils Relative: 0.4 % (ref 0.0–3.0)
EOS PCT: 0.9 % (ref 0.0–5.0)
Eosinophils Absolute: 0.1 10*3/uL (ref 0.0–0.7)
HEMATOCRIT: 38.3 % (ref 36.0–46.0)
Hemoglobin: 13.1 g/dL (ref 12.0–15.0)
LYMPHS ABS: 2.8 10*3/uL (ref 0.7–4.0)
LYMPHS PCT: 33.8 % (ref 12.0–46.0)
MCHC: 34.1 g/dL (ref 30.0–36.0)
MCV: 94.8 fl (ref 78.0–100.0)
MONOS PCT: 5.1 % (ref 3.0–12.0)
Monocytes Absolute: 0.4 10*3/uL (ref 0.1–1.0)
Neutro Abs: 5 10*3/uL (ref 1.4–7.7)
Neutrophils Relative %: 59.8 % (ref 43.0–77.0)
Platelets: 282 10*3/uL (ref 150.0–400.0)
RBC: 4.04 Mil/uL (ref 3.87–5.11)
RDW: 12.7 % (ref 11.5–15.5)
WBC: 8.3 10*3/uL (ref 4.0–10.5)

## 2016-04-02 LAB — URINALYSIS, ROUTINE W REFLEX MICROSCOPIC
Bilirubin Urine: NEGATIVE
Hgb urine dipstick: NEGATIVE
Leukocytes, UA: NEGATIVE
Nitrite: NEGATIVE
RBC / HPF: NONE SEEN (ref 0–?)
Specific Gravity, Urine: >=1.03 — AB (ref 1.000–1.030)
Total Protein, Urine: NEGATIVE
URINE GLUCOSE: NEGATIVE
Urobilinogen, UA: 0.2 (ref 0.0–1.0)
WBC UA: NONE SEEN (ref 0–?)
pH: 5.5 (ref 5.0–8.0)

## 2016-04-02 LAB — LIPID PANEL
Cholesterol: 157 mg/dL (ref 0–200)
HDL: 50.4 mg/dL (ref >39.00)
LDL CALC: 94 mg/dL (ref 0–99)
NONHDL: 106.87
Total CHOL/HDL Ratio: 3
Triglycerides: 65 mg/dL (ref 0.0–149.0)
VLDL: 13 mg/dL (ref 0.0–40.0)

## 2016-04-02 LAB — TSH: TSH: 2.02 u[IU]/mL (ref 0.35–4.50)

## 2016-04-03 DIAGNOSIS — Z0000 Encounter for general adult medical examination without abnormal findings: Secondary | ICD-10-CM | POA: Insufficient documentation

## 2016-04-03 NOTE — Assessment & Plan Note (Signed)
ghm utd Check labs  See avs  

## 2016-04-05 LAB — CYTOLOGY - PAP
DIAGNOSIS: NEGATIVE
HPV (WINDOPATH): NOT DETECTED

## 2016-04-08 ENCOUNTER — Ambulatory Visit (HOSPITAL_BASED_OUTPATIENT_CLINIC_OR_DEPARTMENT_OTHER)
Admission: RE | Admit: 2016-04-08 | Discharge: 2016-04-08 | Disposition: A | Discharge status: Home / Self Care | Payer: 59 | Source: Ambulatory Visit | Attending: Family Medicine | Admitting: Family Medicine

## 2016-04-08 DIAGNOSIS — E2839 Other primary ovarian failure: Secondary | ICD-10-CM | POA: Insufficient documentation

## 2016-07-17 DIAGNOSIS — M9901 Segmental and somatic dysfunction of cervical region: Secondary | ICD-10-CM | POA: Diagnosis not present

## 2016-07-17 DIAGNOSIS — M9902 Segmental and somatic dysfunction of thoracic region: Secondary | ICD-10-CM | POA: Diagnosis not present

## 2016-07-17 DIAGNOSIS — M609 Myositis, unspecified: Secondary | ICD-10-CM | POA: Diagnosis not present

## 2016-10-11 ENCOUNTER — Ambulatory Visit (INDEPENDENT_AMBULATORY_CARE_PROVIDER_SITE_OTHER): Payer: 59 | Admitting: Family Medicine

## 2016-10-11 ENCOUNTER — Encounter: Payer: Self-pay | Admitting: Family Medicine

## 2016-10-11 DIAGNOSIS — R109 Unspecified abdominal pain: Secondary | ICD-10-CM | POA: Diagnosis not present

## 2016-10-11 NOTE — Patient Instructions (Signed)

## 2016-10-11 NOTE — Progress Notes (Signed)
Patient ID: Julie Gibbs, female   DOB: [birth year], 51 y.o.   MRN: [id number]    Subjective:  I acted as a Neurosurgeonscribe for Dr. Zola ButtonLowne-Chase.  Apolonio SchneidersSheketia, CMA   Patient ID: Julie RobHolly Eckardt, female    DOB: [birth year], 51 y.o.   MRN: [id number]  Chief Complaint  Patient presents with  . Hernia    HPI  Patient is in today for possible hernia.  Started feeling pulling and burning in her RLQ the first week of April.  About 2 weeks ago she did some yard work and thinks that she may have pulled something.  She does not feel a bulge or anything.  She had a muscle plication 7 years ago by a Engineer, petroleumplastic surgeon.  Patient Care Team: Zola ButtonLowne Chase, Grayling CongressYvonne R, DO as PCP - General (Family Medicine)   Past Medical History:  Diagnosis Date  . Blackout spell    per pt/sedation can cause drop in B/P and blackout spell  . 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 ABDOMINAL HYSTERECTOMY  07/1992  . ABDOMINOPLASTY  "About 5 years ago"  . TONSILLECTOMY  Kindergarten    Family History  Problem Relation Age of Onset  . Ataxia Mother   . Other Mother        Hereditary Vitamin E Deficiency  . Cancer Father        Neuroendocrine CA  . Mental retardation Maternal Grandfather   . Coronary artery disease Paternal Grandfather   . Suicidality Paternal Grandfather   . Heart disease Paternal Grandmother   . Heart failure Paternal Grandmother     Social History   Social History  . Marital status: Married    Spouse name: N/A  . Number of children: N/A  . Years of education: N/A   Occupational History  . Not on file.   Social History Main Topics  . Smoking status: Never Smoker  . Smokeless tobacco: Never Used  . Alcohol use 0.6 - 1.2 oz/week    1 - 2 Glasses of wine per week  . Drug use: No  . Sexual activity: Yes   Other Topics Concern  . Not on file   Social History Narrative  . No narrative on file    Outpatient Medications Prior to  Visit  Medication Sig Dispense Refill  . Cholecalciferol (VITAMIN D3) 10000 units TABS Take 1 tablet by mouth daily. Reported on 06/27/2015    . clobetasol ointment (TEMOVATE) 0.05 % use prn  1  . NON FORMULARY 1 tablet daily. [employer] Vessel Care    . triamcinolone cream (KENALOG) 0.1 % prn  1   No facility-administered medications prior to visit.     No Known Allergies  Review of Systems  Constitutional: Negative for fever and malaise/fatigue.  HENT: Negative for congestion.   Eyes: Negative for blurred vision.  Respiratory: Negative for cough and shortness of breath.   Cardiovascular: Negative for chest pain, palpitations and leg swelling.  Gastrointestinal: Positive for abdominal pain. Negative for vomiting.  Musculoskeletal: Negative for back pain.  Skin: Negative for rash.  Neurological: Negative for loss of consciousness and headaches.       Objective:    Physical Exam  Constitutional: She is oriented to person, place, and time. She appears well-developed and well-nourished.  HENT:  Head: Normocephalic and atraumatic.  Eyes: Conjunctivae and EOM are normal.  Neck: Normal range of motion. Neck supple. No JVD present. Carotid  bruit is not present. No thyromegaly present.  Cardiovascular: Normal rate, regular rhythm and normal heart sounds.   No murmur heard. Pulmonary/Chest: Effort normal and breath sounds normal. No respiratory distress. She has no wheezes. She has no rales. She exhibits no tenderness.  Abdominal: Soft. Normal appearance, normal aorta and bowel sounds are normal.  Musculoskeletal: She exhibits no edema.  Neurological: She is alert and oriented to person, place, and time.  Psychiatric: She has a normal mood and affect.  Nursing note and vitals reviewed.   BP 118/72 (BP Location: Left Arm, Cuff Size: Normal)   Pulse 74   Temp 97.8 F (36.6 C) (Oral)   Resp 16   Ht 5\' 2"  (1.575 m)   Wt 193 lb 12.8 oz (87.9 kg)   SpO2 97%   BMI 35.45 kg/m  Wt  Readings from Last 3 Encounters:  10/11/16 193 lb 12.8 oz (87.9 kg)  04/01/16 198 lb (89.8 kg)  06/27/15 201 lb (91.2 kg)   BP Readings from Last 3 Encounters:  10/11/16 118/72  04/01/16 102/60  06/27/15 108/69      There is no immunization history on file for this patient.  Health Maintenance  Topic Date Due  . INFLUENZA VACCINE  04/01/2017 (Originally 10/16/2016)  . TETANUS/TDAP  04/01/2017 (Originally 09/05/1982)  . MAMMOGRAM  03/28/2018  . PAP SMEAR  04/02/2019  . COLONOSCOPY  06/26/2025  . Hepatitis C Screening  Completed  . HIV Screening  Completed    Lab Results  Component Value Date   WBC 8.3 04/01/2016   HGB 13.1 04/01/2016   HCT 38.3 04/01/2016   PLT 282.0 04/01/2016   GLUCOSE 80 04/01/2016   CHOL 157 04/01/2016   TRIG 65.0 04/01/2016   HDL 50.40 04/01/2016   LDLCALC 94 04/01/2016   ALT 10 04/01/2016   AST 13 04/01/2016   NA 137 04/01/2016   K 4.1 04/01/2016   CL 105 04/01/2016   CREATININE 0.93 04/01/2016   BUN 16 04/01/2016   CO2 26 04/01/2016   TSH 2.02 04/01/2016    Lab Results  Component Value Date   TSH 2.02 04/01/2016   Lab Results  Component Value Date   WBC 8.3 04/01/2016   HGB 13.1 04/01/2016   HCT 38.3 04/01/2016   MCV 94.8 04/01/2016   PLT 282.0 04/01/2016   Lab Results  Component Value Date   NA 137 04/01/2016   K 4.1 04/01/2016   CO2 26 04/01/2016   GLUCOSE 80 04/01/2016   BUN 16 04/01/2016   CREATININE 0.93 04/01/2016   BILITOT 0.4 04/01/2016   ALKPHOS 59 04/01/2016   AST 13 04/01/2016   ALT 10 04/01/2016   PROT 6.7 04/01/2016   ALBUMIN 4.1 04/01/2016   CALCIUM 8.9 04/01/2016   GFR 67.14 04/01/2016   Lab Results  Component Value Date   CHOL 157 04/01/2016   Lab Results  Component Value Date   HDL 50.40 04/01/2016   Lab Results  Component Value Date   LDLCALC 94 04/01/2016   Lab Results  Component Value Date   TRIG 65.0 04/01/2016   Lab Results  Component Value Date   CHOLHDL 3 04/01/2016   No results  found for: HGBA1C       Assessment & Plan:   Problem List Items Addressed This Visit      Unprioritized   Abdominal pain in female    ? Pulled muscle  Pain has resolved Pt will where her support belt if needed and rto if pain  returns          I am having Ms. Claudette LawsWatson maintain her clobetasol ointment, triamcinolone cream, NON FORMULARY, and Vitamin D3.  No orders of the defined types were placed in this encounter.   CMA served as Neurosurgeonscribe during this visit. History, Physical and Plan performed by medical provider. Documentation and orders reviewed and attested to.  Donato SchultzYvonne R Lowne Chase, DO

## 2016-10-13 ENCOUNTER — Encounter: Payer: Self-pay | Admitting: Family Medicine

## 2016-10-13 DIAGNOSIS — R109 Unspecified abdominal pain: Secondary | ICD-10-CM | POA: Insufficient documentation

## 2016-10-13 NOTE — Assessment & Plan Note (Signed)
?   Pulled muscle  Pain has resolved Pt will where her support belt if needed and rto if pain returns

## 2016-10-14 ENCOUNTER — Encounter: Payer: Self-pay | Admitting: Family Medicine

## 2016-10-14 NOTE — Telephone Encounter (Signed)
It could be any number of things---  When she says "burning"  --- burning with urinating , heartburn?   She may need ov

## 2016-10-15 DIAGNOSIS — M9904 Segmental and somatic dysfunction of sacral region: Secondary | ICD-10-CM | POA: Diagnosis not present

## 2016-10-15 DIAGNOSIS — M7631 Iliotibial band syndrome, right leg: Secondary | ICD-10-CM | POA: Diagnosis not present

## 2016-10-15 DIAGNOSIS — M609 Myositis, unspecified: Secondary | ICD-10-CM | POA: Diagnosis not present

## 2016-10-17 DIAGNOSIS — M609 Myositis, unspecified: Secondary | ICD-10-CM | POA: Diagnosis not present

## 2016-10-17 DIAGNOSIS — M7631 Iliotibial band syndrome, right leg: Secondary | ICD-10-CM | POA: Diagnosis not present

## 2016-10-17 DIAGNOSIS — M9904 Segmental and somatic dysfunction of sacral region: Secondary | ICD-10-CM | POA: Diagnosis not present

## 2016-10-23 DIAGNOSIS — M9904 Segmental and somatic dysfunction of sacral region: Secondary | ICD-10-CM | POA: Diagnosis not present

## 2016-10-23 DIAGNOSIS — M609 Myositis, unspecified: Secondary | ICD-10-CM | POA: Diagnosis not present

## 2016-10-23 DIAGNOSIS — M7631 Iliotibial band syndrome, right leg: Secondary | ICD-10-CM | POA: Diagnosis not present

## 2017-01-31 ENCOUNTER — Other Ambulatory Visit: Payer: Self-pay | Admitting: Family Medicine

## 2017-01-31 ENCOUNTER — Telehealth: Payer: Self-pay | Admitting: Family Medicine

## 2017-01-31 DIAGNOSIS — R1030 Lower abdominal pain, unspecified: Secondary | ICD-10-CM

## 2017-01-31 NOTE — Telephone Encounter (Signed)
Patient called and sttts she is still having lower admorn pains and would like to see what she should do in the meanwhile of her appointment on Tuesday.  Please call patient back with answer.

## 2017-01-31 NOTE — Telephone Encounter (Signed)
Pt notified of Dr. Maryln GottronLowne-Chase's comments and instructions and verbalized understanding. Message routed to PCP to put CT order in

## 2017-01-31 NOTE — Telephone Encounter (Signed)
Ct abd / pelvis with contrast --- low abd pain We can try to get this done before tues--- may need stat bmp If pain worsens over weekend -- go to er

## 2017-02-04 ENCOUNTER — Ambulatory Visit (INDEPENDENT_AMBULATORY_CARE_PROVIDER_SITE_OTHER): Payer: 59 | Admitting: Family Medicine

## 2017-02-04 ENCOUNTER — Encounter: Payer: Self-pay | Admitting: Family Medicine

## 2017-02-04 DIAGNOSIS — R109 Unspecified abdominal pain: Secondary | ICD-10-CM | POA: Diagnosis not present

## 2017-02-04 LAB — COMPREHENSIVE METABOLIC PANEL
ALBUMIN: 4.2 g/dL (ref 3.5–5.2)
ALT: 12 U/L (ref 0–35)
AST: 15 U/L (ref 0–37)
Alkaline Phosphatase: 61 U/L (ref 39–117)
BUN: 11 mg/dL (ref 6–23)
CHLORIDE: 103 meq/L (ref 96–112)
CO2: 29 mEq/L (ref 19–32)
CREATININE: 0.87 mg/dL (ref 0.40–1.20)
Calcium: 9 mg/dL (ref 8.4–10.5)
GFR: 72.28 mL/min (ref >60.00)
GLUCOSE: 103 mg/dL — AB (ref 70–99)
Potassium: 4.3 mEq/L (ref 3.5–5.1)
SODIUM: 137 meq/L (ref 135–145)
Total Bilirubin: 0.7 mg/dL (ref 0.2–1.2)
Total Protein: 6.7 g/dL (ref 6.0–8.3)

## 2017-02-04 LAB — CBC WITH DIFFERENTIAL/PLATELET
Basophils Absolute: 0 10*3/uL (ref 0.0–0.1)
Basophils Relative: 0.5 % (ref 0.0–3.0)
EOS ABS: 0.1 10*3/uL (ref 0.0–0.7)
Eosinophils Relative: 1.1 % (ref 0.0–5.0)
HCT: 38.6 % (ref 36.0–46.0)
Hemoglobin: 13.1 g/dL (ref 12.0–15.0)
Lymphocytes Relative: 31.1 % (ref 12.0–46.0)
Lymphs Abs: 2.2 10*3/uL (ref 0.7–4.0)
MCHC: 34 g/dL (ref 30.0–36.0)
MCV: 95.9 fl (ref 78.0–100.0)
MONO ABS: 0.6 10*3/uL (ref 0.1–1.0)
Monocytes Relative: 7.8 % (ref 3.0–12.0)
Neutro Abs: 4.3 10*3/uL (ref 1.4–7.7)
Neutrophils Relative %: 59.5 % (ref 43.0–77.0)
Platelets: 279 10*3/uL (ref 150.0–400.0)
RBC: 4.02 Mil/uL (ref 3.87–5.11)
RDW: 12.8 % (ref 11.5–15.5)
WBC: 7.2 10*3/uL (ref 4.0–10.5)

## 2017-02-04 NOTE — Assessment & Plan Note (Signed)
Check CT abd /pelvis Check labs If pain worsens go to ER

## 2017-02-04 NOTE — Progress Notes (Signed)
Patient ID: Julie Gibbs Swinger, female   DOB: Jul 11, 1965, 51 y.o.   MRN: [id number]    Subjective:  I acted as a Neurosurgeonscribe for Dr. Zola ButtonLowne-Chase.  Apolonio SchneidersSheketia, CMA   Patient ID: Julie Gibbs Riera, female    DOB: Jul 11, 1965, [age] y.o.   MRN: [id number]  Chief Complaint  Patient presents with  . abdminal pain    umbilical area    HPI  Patient is in today for follow up abdominal pain.  Pain is in R low quad --- pain is a pinching / burning sensation .  It comes and goes.  See last ov in July  She has to hold her side to reach up and and bend down.   No nvd,  No abd pain now.  No bulge.  Support belt does help.     Patient Care Team: Zola ButtonLowne Chase, Grayling CongressYvonne R, DO as PCP - General (Family Medicine)   Past Medical History:  Diagnosis Date  . Blackout spell    per pt/sedation can cause drop in B/P and blackout spell  . 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 ABDOMINAL HYSTERECTOMY  07/1992  . ABDOMINOPLASTY  "About 5 years ago"  . TONSILLECTOMY  Kindergarten    Family History  Problem Relation Age of Onset  . Ataxia Mother   . Other Mother        Hereditary Vitamin E Deficiency  . Cancer Father        Neuroendocrine CA  . Mental retardation Maternal Grandfather   . Coronary artery disease Paternal Grandfather   . Suicidality Paternal Grandfather   . Heart disease Paternal Grandmother   . Heart failure Paternal Grandmother     Social History   Socioeconomic History  . Marital status: Married    Spouse name: Not on file  . Number of children: Not on file  . Years of education: Not on file  . Highest education level: Not on file  Social Needs  . Financial resource strain: Not on file  . Food insecurity - worry: Not on file  . Food insecurity - inability: Not on file  . Transportation needs - medical: Not on file  . Transportation needs - non-medical: Not on file  Occupational History  . Not on file  Tobacco Use  .  Smoking status: Never Smoker  . Smokeless tobacco: Never Used  Substance and Sexual Activity  . Alcohol use: Yes    Alcohol/week: 0.6 - 1.2 oz    Types: 1 - 2 Glasses of wine per week  . Drug use: No  . Sexual activity: Yes  Other Topics Concern  . Not on file  Social History Narrative  . Not on file    Outpatient Medications Prior to Visit  Medication Sig Dispense Refill  . Cholecalciferol (VITAMIN D3) 10000 units TABS Take 1 tablet by mouth daily. Reported on 06/27/2015    . clobetasol ointment (TEMOVATE) 0.05 % use prn  1  . NON FORMULARY 1 tablet daily. [employer] Vessel Care    . triamcinolone cream (KENALOG) 0.1 % prn  1   No facility-administered medications prior to visit.     No Known Allergies  Review of Systems  Constitutional: Negative for fever and malaise/fatigue.  HENT: Negative for congestion.   Eyes: Negative for blurred vision.  Respiratory: Negative for cough and shortness of breath.   Cardiovascular: Negative for chest pain, palpitations and leg swelling.  Gastrointestinal:  Positive for abdominal pain. Negative for vomiting.  Musculoskeletal: Negative for back pain.  Skin: Negative for rash.  Neurological: Negative for loss of consciousness and headaches.       Objective:    Physical Exam  Constitutional: She is oriented to person, place, and time. She appears well-developed and well-nourished.  HENT:  Head: Normocephalic and atraumatic.  Eyes: Conjunctivae and EOM are normal.  Neck: Normal range of motion. Neck supple. No JVD present. Carotid bruit is not present. No thyromegaly present.  Cardiovascular: Normal rate, regular rhythm and normal heart sounds.  No murmur heard. Pulmonary/Chest: Effort normal and breath sounds normal. No respiratory distress. She has no wheezes. She has no rales. She exhibits no tenderness.  Abdominal: Soft. Bowel sounds are normal. She exhibits no distension and no mass. There is tenderness in the right lower  quadrant. There is no rigidity, no rebound, no guarding, no tenderness at McBurney's point and negative Murphy's sign.  Musculoskeletal: She exhibits no edema.  Neurological: She is alert and oriented to person, place, and time.  Psychiatric: She has a normal mood and affect.  Nursing note and vitals reviewed.   BP 120/72 (BP Location: Right Arm, Cuff Size: Large)   Pulse 64   Temp 98.1 F (36.7 C) (Oral)   Resp 16   Ht 5\' 2"  (1.575 m)   Wt 195 lb 3.2 oz (88.5 kg)   SpO2 98%   BMI 35.70 kg/m  Wt Readings from Last 3 Encounters:  02/04/17 195 lb 3.2 oz (88.5 kg)  10/11/16 193 lb 12.8 oz (87.9 kg)  04/01/16 198 lb (89.8 kg)   BP Readings from Last 3 Encounters:  02/04/17 120/72  10/11/16 118/72  04/01/16 102/60      There is no immunization history on file for this patient.  Health Maintenance  Topic Date Due  . INFLUENZA VACCINE  04/01/2017 (Originally 10/16/2016)  . TETANUS/TDAP  04/01/2017 (Originally 09/05/1982)  . MAMMOGRAM  03/28/2018  . PAP SMEAR  04/02/2019  . COLONOSCOPY  06/26/2025  . Hepatitis C Screening  Completed  . HIV Screening  Completed    Lab Results  Component Value Date   WBC 8.3 04/01/2016   HGB 13.1 04/01/2016   HCT 38.3 04/01/2016   PLT 282.0 04/01/2016   GLUCOSE 80 04/01/2016   CHOL 157 04/01/2016   TRIG 65.0 04/01/2016   HDL 50.40 04/01/2016   LDLCALC 94 04/01/2016   ALT 10 04/01/2016   AST 13 04/01/2016   NA 137 04/01/2016   K 4.1 04/01/2016   CL 105 04/01/2016   CREATININE 0.93 04/01/2016   BUN 16 04/01/2016   CO2 26 04/01/2016   TSH 2.02 04/01/2016    Lab Results  Component Value Date   TSH 2.02 04/01/2016   Lab Results  Component Value Date   WBC 8.3 04/01/2016   HGB 13.1 04/01/2016   HCT 38.3 04/01/2016   MCV 94.8 04/01/2016   PLT 282.0 04/01/2016   Lab Results  Component Value Date   NA 137 04/01/2016   K 4.1 04/01/2016   CO2 26 04/01/2016   GLUCOSE 80 04/01/2016   BUN 16 04/01/2016   CREATININE 0.93  04/01/2016   BILITOT 0.4 04/01/2016   ALKPHOS 59 04/01/2016   AST 13 04/01/2016   ALT 10 04/01/2016   PROT 6.7 04/01/2016   ALBUMIN 4.1 04/01/2016   CALCIUM 8.9 04/01/2016   GFR 67.14 04/01/2016   Lab Results  Component Value Date   CHOL 157 04/01/2016  Lab Results  Component Value Date   HDL 50.40 04/01/2016   Lab Results  Component Value Date   LDLCALC 94 04/01/2016   Lab Results  Component Value Date   TRIG 65.0 04/01/2016   Lab Results  Component Value Date   CHOLHDL 3 04/01/2016   No results found for: HGBA1C       Assessment & Plan:   Problem List Items Addressed This Visit      Unprioritized   Abdominal pain in female    Check CT abd /pelvis Check labs If pain worsens go to ER       Relevant Orders   CBC with Differential/Platelet   Comprehensive metabolic panel      I am having Julie Gibbs Heap maintain her clobetasol ointment, triamcinolone cream, NON FORMULARY, and Vitamin D3.  No orders of the defined types were placed in this encounter.   CMA served as Neurosurgeonscribe during this visit. History, Physical and Plan performed by medical provider. Documentation and orders reviewed and attested to.  Donato SchultzYvonne R Lowne Chase, DO

## 2017-02-04 NOTE — Patient Instructions (Signed)

## 2017-02-11 ENCOUNTER — Ambulatory Visit (HOSPITAL_BASED_OUTPATIENT_CLINIC_OR_DEPARTMENT_OTHER)
Admission: RE | Admit: 2017-02-11 | Discharge: 2017-02-11 | Disposition: A | Discharge status: Home / Self Care | Payer: 59 | Source: Ambulatory Visit | Attending: Family Medicine | Admitting: Family Medicine

## 2017-02-11 ENCOUNTER — Encounter (HOSPITAL_BASED_OUTPATIENT_CLINIC_OR_DEPARTMENT_OTHER): Payer: Self-pay

## 2017-02-11 DIAGNOSIS — R109 Unspecified abdominal pain: Secondary | ICD-10-CM | POA: Diagnosis not present

## 2017-02-11 DIAGNOSIS — N83202 Unspecified ovarian cyst, left side: Secondary | ICD-10-CM | POA: Insufficient documentation

## 2017-02-11 DIAGNOSIS — R1030 Lower abdominal pain, unspecified: Secondary | ICD-10-CM | POA: Insufficient documentation

## 2017-02-11 MED ORDER — IOPAMIDOL (ISOVUE-300) INJECTION 61%
100.0000 mL | Freq: Once | INTRAVENOUS | Status: AC | PRN
  Administered 2017-02-11: 100 mL via INTRAVENOUS

## 2017-02-14 ENCOUNTER — Other Ambulatory Visit: Payer: Self-pay | Admitting: *Deleted

## 2017-02-17 ENCOUNTER — Other Ambulatory Visit: Payer: Self-pay | Admitting: Family Medicine

## 2017-02-17 DIAGNOSIS — R109 Unspecified abdominal pain: Secondary | ICD-10-CM

## 2017-02-19 DIAGNOSIS — M7918 Myalgia, other site: Secondary | ICD-10-CM | POA: Diagnosis not present

## 2017-06-02 ENCOUNTER — Other Ambulatory Visit: Payer: Self-pay | Admitting: Family Medicine

## 2017-06-02 DIAGNOSIS — Z1231 Encounter for screening mammogram for malignant neoplasm of breast: Secondary | ICD-10-CM

## 2017-06-18 ENCOUNTER — Ambulatory Visit (HOSPITAL_BASED_OUTPATIENT_CLINIC_OR_DEPARTMENT_OTHER)
Admission: RE | Admit: 2017-06-18 | Discharge: 2017-06-18 | Disposition: A | Discharge status: Home / Self Care | Payer: 59 | Source: Ambulatory Visit | Attending: Family Medicine | Admitting: Family Medicine

## 2017-06-18 DIAGNOSIS — Z1231 Encounter for screening mammogram for malignant neoplasm of breast: Secondary | ICD-10-CM

## 2017-07-28 ENCOUNTER — Encounter: Payer: 59 | Admitting: Family Medicine

## 2017-09-08 ENCOUNTER — Encounter: Payer: 59 | Admitting: Family Medicine

## 2017-11-10 ENCOUNTER — Encounter: Payer: Self-pay | Admitting: Family Medicine

## 2017-11-10 ENCOUNTER — Ambulatory Visit (INDEPENDENT_AMBULATORY_CARE_PROVIDER_SITE_OTHER): Payer: 59 | Admitting: Family Medicine

## 2017-11-10 VITALS — BP 112/64 | HR 63 | Temp 98.3°F | Resp 16 | Ht 61.5 in | Wt 194.2 lb

## 2017-11-10 DIAGNOSIS — Z0000 Encounter for general adult medical examination without abnormal findings: Secondary | ICD-10-CM

## 2017-11-10 DIAGNOSIS — Z23 Encounter for immunization: Secondary | ICD-10-CM

## 2017-11-10 NOTE — Assessment & Plan Note (Signed)
ghm utd See AVS Check labs shingrix #1

## 2017-11-10 NOTE — Progress Notes (Signed)
Patient ID: Julie RobHolly Shaddock, female    DOB: 1965/10/29  Age: 52 y.o. MRN: [id number]    Subjective:  Subjective  HPI Julie Gibbs presents for cpe and labs.  No complaints.   Review of Systems  Constitutional: Negative for chills and fever.  HENT: Negative for congestion and hearing loss.   Eyes: Negative for discharge.  Respiratory: Negative for cough and shortness of breath.   Cardiovascular: Negative for chest pain, palpitations and leg swelling.  Gastrointestinal: Negative for abdominal pain, blood in stool, constipation, diarrhea, nausea and vomiting.  Genitourinary: Negative for dysuria, frequency, hematuria and urgency.  Musculoskeletal: Negative for back pain and myalgias.  Skin: Negative for rash.  Allergic/Immunologic: Negative for environmental allergies.  Neurological: Negative for dizziness, weakness and headaches.  Hematological: Does not bruise/bleed easily.  Psychiatric/Behavioral: Negative for suicidal ideas. The patient is not nervous/anxious.     History Past Medical History:  Diagnosis Date  . Blackout spell    per pt/sedation can cause drop in B/P and blackout spell  . 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   Her family history includes Ataxia in her mother; Cancer in her father; Coronary artery disease in her paternal grandfather; Heart disease in her paternal grandmother; Heart failure in her paternal grandmother; Mental retardation in her maternal grandfather; Other in her mother; Suicidality in her paternal grandfather.She reports that she has never smoked. She has never used smokeless tobacco. She reports that she drinks about 1.0 - 2.0 standard drinks of alcohol per week. She reports that she does not use drugs.  No current outpatient medications on file prior to visit.   No current  facility-administered medications on file prior to visit.      Objective:  Objective  Physical Exam  Constitutional: She is oriented to person, place, and time. She appears well-developed and well-nourished. No distress.  HENT:  Right Ear: External ear normal.  Left Ear: External ear normal.  Nose: Nose normal.  Mouth/Throat: Oropharynx is clear and moist.  Eyes: Pupils are equal, round, and reactive to light. EOM are normal.  Neck: Normal range of motion. Neck supple.  Cardiovascular: Normal rate, regular rhythm and normal heart sounds.  No murmur heard. Pulmonary/Chest: Effort normal and breath sounds normal. No respiratory distress. She has no wheezes. She has no rales. She exhibits no tenderness.  Musculoskeletal: Normal range of motion. She exhibits no edema, tenderness or deformity.  Neurological: She is alert and oriented to person, place, and time.  Skin: Skin is warm and dry.  Psychiatric: She has a normal mood and affect. Her behavior is normal. Judgment and thought content normal.  Nursing note and vitals reviewed.  BP 112/64 (BP Location: Right Arm, Cuff Size: Normal)   Pulse 63   Temp 98.3 F (36.8 C) (Oral)   Resp 16   Ht 5' 1.5" (1.562 m)   Wt 194 lb 3.2 oz (88.1 kg)   SpO2 100%   BMI 36.10 kg/m  Wt Readings from Last 3 Encounters:  11/10/17 194 lb 3.2 oz (88.1 kg)  02/04/17 195 lb 3.2 oz (88.5 kg)  10/11/16 193 lb 12.8 oz (87.9 kg)     Lab Results  Component Value Date   WBC 7.2 02/04/2017   HGB 13.1 02/04/2017   HCT 38.6 02/04/2017   PLT 279.0 02/04/2017   GLUCOSE 103 (H) 02/04/2017  CHOL 157 04/01/2016   TRIG 65.0 04/01/2016   HDL 50.40 04/01/2016   LDLCALC 94 04/01/2016   ALT 12 02/04/2017   AST 15 02/04/2017   NA 137 02/04/2017   K 4.3 02/04/2017   CL 103 02/04/2017   CREATININE 0.87 02/04/2017   BUN 11 02/04/2017   CO2 29 02/04/2017   TSH 2.02 04/01/2016    Mm Screening Breast Tomo Bilateral  Result Date: 06/18/2017 CLINICAL DATA:   Screening. EXAM: DIGITAL SCREENING BILATERAL MAMMOGRAM WITH TOMO AND CAD COMPARISON:  Previous exam(s). ACR Breast Density Category b: There are scattered areas of fibroglandular density. FINDINGS: There are no findings suspicious for malignancy. Images were processed with CAD. IMPRESSION: No mammographic evidence of malignancy. A result letter of this screening mammogram will be mailed directly to the patient. RECOMMENDATION: Screening mammogram in one year. (Code:SM-B-01Y) BI-RADS CATEGORY  1: Negative. Electronically Signed   By: Amie Portland M.D.   On: 06/18/2017 15:47     Assessment & Plan:  Plan  I have discontinued Julie Gibbs's clobetasol ointment, triamcinolone cream, NON FORMULARY, and Vitamin D3.  No orders of the defined types were placed in this encounter.   Problem List Items Addressed This Visit      Unprioritized   Preventative health care - Primary    ghm utd See AVS Check labs shingrix #1       Relevant Orders   CBC with Differential/Platelet   Comprehensive metabolic panel   Lipid panel   TSH    Other Visit Diagnoses    Need for shingles vaccine       Relevant Orders   Varicella-zoster vaccine IM (Shingrix) (Completed)      Follow-up: Return in about 2 months (around 01/10/2018), or if symptoms worsen or fail to improve, for shingrix #2.  Donato Schultz, DO

## 2017-11-10 NOTE — Patient Instructions (Signed)
Preventive Care 40-64 Years, Female Preventive care refers to lifestyle choices and visits with your health care provider that can promote health and wellness. What does preventive care include?  A yearly physical exam. This is also called an annual well check.  Dental exams once or twice a year.  Routine eye exams. Ask your health care provider how often you should have your eyes checked.  Personal lifestyle choices, including: ? Daily care of your teeth and gums. ? Regular physical activity. ? Eating a healthy diet. ? Avoiding tobacco and drug use. ? Limiting alcohol use. ? Practicing safe sex. ? Taking low-dose aspirin daily starting at age 58. ? Taking vitamin and mineral supplements as recommended by your health care provider. What happens during an annual well check? The services and screenings done by your health care provider during your annual well check will depend on your age, overall health, lifestyle risk factors, and family history of disease. Counseling Your health care provider may ask you questions about your:  Alcohol use.  Tobacco use.  Drug use.  Emotional well-being.  Home and relationship well-being.  Sexual activity.  Eating habits.  Work and work Statistician.  Method of birth control.  Menstrual cycle.  Pregnancy history.  Screening You may have the following tests or measurements:  Height, weight, and BMI.  Blood pressure.  Lipid and cholesterol levels. These may be checked every 5 years, or more frequently if you are over 81 years old.  Skin check.  Lung cancer screening. You may have this screening every year starting at age 78 if you have a 30-pack-year history of smoking and currently smoke or have quit within the past 15 years.  Fecal occult blood test (FOBT) of the stool. You may have this test every year starting at age 65.  Flexible sigmoidoscopy or colonoscopy. You may have a sigmoidoscopy every 5 years or a colonoscopy  every 10 years starting at age 30.  Hepatitis C blood test.  Hepatitis B blood test.  Sexually transmitted disease (STD) testing.  Diabetes screening. This is done by checking your blood sugar (glucose) after you have not eaten for a while (fasting). You may have this done every 1-3 years.  Mammogram. This may be done every 1-2 years. Talk to your health care provider about when you should start having regular mammograms. This may depend on whether you have a family history of breast cancer.  BRCA-related cancer screening. This may be done if you have a family history of breast, ovarian, tubal, or peritoneal cancers.  Pelvic exam and Pap test. This may be done every 3 years starting at age 80. Starting at age 36, this may be done every 5 years if you have a Pap test in combination with an HPV test.  Bone density scan. This is done to screen for osteoporosis. You may have this scan if you are at high risk for osteoporosis.  Discuss your test results, treatment options, and if necessary, the need for more tests with your health care provider. Vaccines Your health care provider may recommend certain vaccines, such as:  Influenza vaccine. This is recommended every year.  Tetanus, diphtheria, and acellular pertussis (Tdap, Td) vaccine. You may need a Td booster every 10 years.  Varicella vaccine. You may need this if you have not been vaccinated.  Zoster vaccine. You may need this after age 5.  Measles, mumps, and rubella (MMR) vaccine. You may need at least one dose of MMR if you were born in  1957 or later. You may also need a second dose.  Pneumococcal 13-valent conjugate (PCV13) vaccine. You may need this if you have certain conditions and were not previously vaccinated.  Pneumococcal polysaccharide (PPSV23) vaccine. You may need one or two doses if you smoke cigarettes or if you have certain conditions.  Meningococcal vaccine. You may need this if you have certain  conditions.  Hepatitis A vaccine. You may need this if you have certain conditions or if you travel or work in places where you may be exposed to hepatitis A.  Hepatitis B vaccine. You may need this if you have certain conditions or if you travel or work in places where you may be exposed to hepatitis B.  Haemophilus influenzae type b (Hib) vaccine. You may need this if you have certain conditions.  Talk to your health care provider about which screenings and vaccines you need and how often you need them. This information is not intended to replace advice given to you by your health care provider. Make sure you discuss any questions you have with your health care provider. Document Released: 03/31/2015 Document Revised: 11/22/2015 Document Reviewed: 01/03/2015 Elsevier Interactive Patient Education  2018 Elsevier Inc.  

## 2017-11-10 NOTE — Assessment & Plan Note (Signed)
ghm utd Check labs  See avs  

## 2017-11-11 LAB — LIPID PANEL
CHOLESTEROL: 142 mg/dL (ref 0–200)
HDL: 47.5 mg/dL (ref >39.00)
LDL Cholesterol: 80 mg/dL (ref 0–99)
NonHDL: 94.46
TRIGLYCERIDES: 71 mg/dL (ref 0.0–149.0)
Total CHOL/HDL Ratio: 3
VLDL: 14.2 mg/dL (ref 0.0–40.0)

## 2017-11-11 LAB — CBC WITH DIFFERENTIAL/PLATELET
BASOS PCT: 1.2 % (ref 0.0–3.0)
Basophils Absolute: 0.1 10*3/uL (ref 0.0–0.1)
EOS ABS: 0.1 10*3/uL (ref 0.0–0.7)
EOS PCT: 0.8 % (ref 0.0–5.0)
HEMATOCRIT: 39.8 % (ref 36.0–46.0)
HEMOGLOBIN: 13.4 g/dL (ref 12.0–15.0)
LYMPHS PCT: 35.5 % (ref 12.0–46.0)
Lymphs Abs: 2.5 10*3/uL (ref 0.7–4.0)
MCHC: 33.6 g/dL (ref 30.0–36.0)
MCV: 94.9 fl (ref 78.0–100.0)
MONO ABS: 0.4 10*3/uL (ref 0.1–1.0)
Monocytes Relative: 6.4 % (ref 3.0–12.0)
Neutro Abs: 3.9 10*3/uL (ref 1.4–7.7)
Neutrophils Relative %: 56.1 % (ref 43.0–77.0)
Platelets: 295 10*3/uL (ref 150.0–400.0)
RBC: 4.2 Mil/uL (ref 3.87–5.11)
RDW: 13.2 % (ref 11.5–15.5)
WBC: 7 10*3/uL (ref 4.0–10.5)

## 2017-11-11 LAB — COMPREHENSIVE METABOLIC PANEL
ALBUMIN: 4.2 g/dL (ref 3.5–5.2)
ALK PHOS: 60 U/L (ref 39–117)
ALT: 10 U/L (ref 0–35)
AST: 12 U/L (ref 0–37)
BILIRUBIN TOTAL: 0.6 mg/dL (ref 0.2–1.2)
BUN: 11 mg/dL (ref 6–23)
CALCIUM: 9.2 mg/dL (ref 8.4–10.5)
CO2: 29 mEq/L (ref 19–32)
CREATININE: 0.96 mg/dL (ref 0.40–1.20)
Chloride: 102 mEq/L (ref 96–112)
GFR: 64.33 mL/min (ref >60.00)
Glucose, Bld: 93 mg/dL (ref 70–99)
Potassium: 4.5 mEq/L (ref 3.5–5.1)
SODIUM: 137 meq/L (ref 135–145)
TOTAL PROTEIN: 6.5 g/dL (ref 6.0–8.3)

## 2017-11-11 LAB — TSH: TSH: 1.87 u[IU]/mL (ref 0.35–4.50)

## 2018-01-12 DIAGNOSIS — M9904 Segmental and somatic dysfunction of sacral region: Secondary | ICD-10-CM | POA: Diagnosis not present

## 2018-01-12 DIAGNOSIS — M609 Myositis, unspecified: Secondary | ICD-10-CM | POA: Diagnosis not present

## 2018-01-12 DIAGNOSIS — M7631 Iliotibial band syndrome, right leg: Secondary | ICD-10-CM | POA: Diagnosis not present

## 2018-01-13 ENCOUNTER — Ambulatory Visit (INDEPENDENT_AMBULATORY_CARE_PROVIDER_SITE_OTHER): Payer: 59

## 2018-01-13 DIAGNOSIS — Z23 Encounter for immunization: Secondary | ICD-10-CM

## 2018-01-13 NOTE — Progress Notes (Signed)
Pre visit review using our clinic tool,if applicable. No additional management support is needed unless otherwise documented below in the visit note.  

## 2018-02-23 DIAGNOSIS — M9904 Segmental and somatic dysfunction of sacral region: Secondary | ICD-10-CM | POA: Diagnosis not present

## 2018-02-23 DIAGNOSIS — M609 Myositis, unspecified: Secondary | ICD-10-CM | POA: Diagnosis not present

## 2018-02-23 DIAGNOSIS — M7631 Iliotibial band syndrome, right leg: Secondary | ICD-10-CM | POA: Diagnosis not present

## 2018-03-18 HISTORY — PX: BREAST BIOPSY: SHX20

## 2018-08-24 ENCOUNTER — Other Ambulatory Visit (HOSPITAL_BASED_OUTPATIENT_CLINIC_OR_DEPARTMENT_OTHER): Payer: Self-pay | Admitting: Family Medicine

## 2018-08-24 DIAGNOSIS — Z1231 Encounter for screening mammogram for malignant neoplasm of breast: Secondary | ICD-10-CM

## 2018-08-27 ENCOUNTER — Ambulatory Visit (HOSPITAL_BASED_OUTPATIENT_CLINIC_OR_DEPARTMENT_OTHER)
Admission: RE | Admit: 2018-08-27 | Discharge: 2018-08-27 | Disposition: A | Discharge status: Home / Self Care | Payer: 59 | Source: Ambulatory Visit | Attending: Family Medicine | Admitting: Family Medicine

## 2018-08-27 ENCOUNTER — Other Ambulatory Visit: Payer: Self-pay

## 2018-08-27 DIAGNOSIS — Z1231 Encounter for screening mammogram for malignant neoplasm of breast: Secondary | ICD-10-CM | POA: Insufficient documentation

## 2018-08-28 ENCOUNTER — Other Ambulatory Visit: Payer: Self-pay | Admitting: Family Medicine

## 2018-08-28 DIAGNOSIS — R928 Other abnormal and inconclusive findings on diagnostic imaging of breast: Secondary | ICD-10-CM

## 2018-09-07 ENCOUNTER — Ambulatory Visit: Payer: 59

## 2018-09-07 ENCOUNTER — Ambulatory Visit
Admission: RE | Admit: 2018-09-07 | Discharge: 2018-09-07 | Disposition: A | Discharge status: Home / Self Care | Payer: 59 | Source: Ambulatory Visit | Attending: Family Medicine | Admitting: Family Medicine

## 2018-09-07 ENCOUNTER — Other Ambulatory Visit: Payer: Self-pay

## 2018-09-07 ENCOUNTER — Other Ambulatory Visit: Payer: Self-pay | Admitting: Family Medicine

## 2018-09-07 DIAGNOSIS — R928 Other abnormal and inconclusive findings on diagnostic imaging of breast: Secondary | ICD-10-CM

## 2018-09-08 ENCOUNTER — Other Ambulatory Visit: Payer: Self-pay | Admitting: Family Medicine

## 2018-09-08 ENCOUNTER — Telehealth: Payer: Self-pay

## 2018-09-08 ENCOUNTER — Ambulatory Visit
Admission: RE | Admit: 2018-09-08 | Discharge: 2018-09-08 | Disposition: A | Discharge status: Home / Self Care | Payer: 59 | Source: Ambulatory Visit | Attending: Family Medicine | Admitting: Family Medicine

## 2018-09-08 DIAGNOSIS — R928 Other abnormal and inconclusive findings on diagnostic imaging of breast: Secondary | ICD-10-CM

## 2018-09-08 NOTE — Telephone Encounter (Signed)
Copied from [place] [id number]. Topic: General - Other >> Sep 08, 2018 12:22 PM Oneta Rack wrote: Dr. Jetta Lout, MD requesting to speak with Dr. Etter Sjogren or nurse regarding addendum to [facility] report, please call [phone] and the nurse will relay message to Dr. Etter Sjogren or nurse.

## 2018-11-11 ENCOUNTER — Other Ambulatory Visit: Payer: Self-pay

## 2018-11-12 ENCOUNTER — Encounter

## 2018-11-12 ENCOUNTER — Ambulatory Visit (INDEPENDENT_AMBULATORY_CARE_PROVIDER_SITE_OTHER): Payer: 59 | Admitting: Family Medicine

## 2018-11-12 ENCOUNTER — Encounter: Payer: Self-pay | Admitting: Family Medicine

## 2018-11-12 VITALS — BP 110/68 | HR 69 | Temp 97.1°F | Resp 18 | Ht 61.5 in | Wt 187.2 lb

## 2018-11-12 DIAGNOSIS — W57XXXA Bitten or stung by nonvenomous insect and other nonvenomous arthropods, initial encounter: Secondary | ICD-10-CM | POA: Diagnosis not present

## 2018-11-12 DIAGNOSIS — S80861A Insect bite (nonvenomous), right lower leg, initial encounter: Secondary | ICD-10-CM

## 2018-11-12 DIAGNOSIS — Z0000 Encounter for general adult medical examination without abnormal findings: Secondary | ICD-10-CM | POA: Diagnosis not present

## 2018-11-12 LAB — CBC WITH DIFFERENTIAL/PLATELET
Basophils Absolute: 0.1 10*3/uL (ref 0.0–0.1)
Basophils Relative: 1 % (ref 0.0–3.0)
Eosinophils Absolute: 0.1 10*3/uL (ref 0.0–0.7)
Eosinophils Relative: 1.5 % (ref 0.0–5.0)
HCT: 38 % (ref 36.0–46.0)
Hemoglobin: 13.1 g/dL (ref 12.0–15.0)
Lymphocytes Relative: 47.4 % — ABNORMAL HIGH (ref 12.0–46.0)
Lymphs Abs: 2.4 10*3/uL (ref 0.7–4.0)
MCHC: 34.5 g/dL (ref 30.0–36.0)
MCV: 95.9 fl (ref 78.0–100.0)
Monocytes Absolute: 0.3 10*3/uL (ref 0.1–1.0)
Monocytes Relative: 5.9 % (ref 3.0–12.0)
Neutro Abs: 2.2 10*3/uL (ref 1.4–7.7)
Neutrophils Relative %: 44.2 % (ref 43.0–77.0)
Platelets: 271 10*3/uL (ref 150.0–400.0)
RBC: 3.96 Mil/uL (ref 3.87–5.11)
RDW: 12.6 % (ref 11.5–15.5)
WBC: 5.1 10*3/uL (ref 4.0–10.5)

## 2018-11-12 LAB — COMPREHENSIVE METABOLIC PANEL
ALT: 17 U/L (ref 0–35)
AST: 16 U/L (ref 0–37)
Albumin: 4.2 g/dL (ref 3.5–5.2)
Alkaline Phosphatase: 64 U/L (ref 39–117)
BUN: 14 mg/dL (ref 6–23)
CO2: 27 mEq/L (ref 19–32)
Calcium: 8.6 mg/dL (ref 8.4–10.5)
Chloride: 104 mEq/L (ref 96–112)
Creatinine, Ser: 0.84 mg/dL (ref 0.40–1.20)
GFR: 70.34 mL/min (ref >60.00)
Glucose, Bld: 97 mg/dL (ref 70–99)
Potassium: 4.2 mEq/L (ref 3.5–5.1)
Sodium: 137 mEq/L (ref 135–145)
Total Bilirubin: 0.6 mg/dL (ref 0.2–1.2)
Total Protein: 6.4 g/dL (ref 6.0–8.3)

## 2018-11-12 LAB — LIPID PANEL
Cholesterol: 181 mg/dL (ref 0–200)
HDL: 60.2 mg/dL (ref >39.00)
LDL Cholesterol: 108 mg/dL — ABNORMAL HIGH (ref 0–99)
NonHDL: 120.38
Total CHOL/HDL Ratio: 3
Triglycerides: 62 mg/dL (ref 0.0–149.0)
VLDL: 12.4 mg/dL (ref 0.0–40.0)

## 2018-11-12 LAB — TSH: TSH: 1.88 u[IU]/mL (ref 0.35–4.50)

## 2018-11-12 NOTE — Progress Notes (Signed)
Subjective:     Julie Gibbs is a 53 y.o. female and is here for a comprehensive physical exam. The patient reports no problems.  Social History   Socioeconomic History  . Marital status: Married    Spouse name: Not on file  . Number of children: Not on file  . Years of education: Not on file  . Highest education level: Not on file  Occupational History  . Not on file  Social Needs  . Financial resource strain: Not on file  . Food insecurity    Worry: Not on file    Inability: Not on file  . Transportation needs    Medical: Not on file    Non-medical: Not on file  Tobacco Use  . Smoking status: Never Smoker  . Smokeless tobacco: Never Used  Substance and Sexual Activity  . Alcohol use: Yes    Alcohol/week: 1.0 - 2.0 standard drinks    Types: 1 - 2 Glasses of wine per week  . Drug use: No  . Sexual activity: Yes  Lifestyle  . Physical activity    Days per week: Not on file    Minutes per session: Not on file  . Stress: Not on file  Relationships  . Social Herbalist on phone: Not on file    Gets together: Not on file    Attends religious service: Not on file    Active member of club or organization: Not on file    Attends meetings of clubs or organizations: Not on file    Relationship status: Not on file  . Intimate partner violence    Fear of current or ex partner: Not on file    Emotionally abused: Not on file    Physically abused: Not on file    Forced sexual activity: Not on file  Other Topics Concern  . Not on file  Social History Narrative  . Not on file   Health Maintenance  Topic Date Due  . INFLUENZA VACCINE  06/30/2023 (Originally 10/17/2018)  . TETANUS/TDAP  11/11/2023 (Originally 09/05/1982)  . PAP SMEAR-Modifier  04/02/2019  . MAMMOGRAM  08/26/2020  . COLONOSCOPY  06/26/2025  . Hepatitis C Screening  Completed  . HIV Screening  Completed    The following portions of the patient's history were reviewed and updated as appropriate:   She  has a past medical history of Blackout spell and Granuloma annulare. She does not have any pertinent problems on file. She  has a past surgical history that includes Abdominal hysterectomy (07/1992); Tonsillectomy (Kindergarten); and Abdominoplasty ("About 5 years ago"). Her family history includes Ataxia in her mother; Cancer in her father; Coronary artery disease in her paternal grandfather; Heart disease in her paternal grandmother; Heart failure in her paternal grandmother; Mental retardation in her maternal grandfather; Other in her mother; Suicidality in her paternal grandfather. She  reports that she has never smoked. She has never used smokeless tobacco. She reports current alcohol use of about 1.0 - 2.0 standard drinks of alcohol per week. She reports that she does not use drugs. She currently has no medications in their medication list. No current outpatient medications on file prior to visit.   No current facility-administered medications on file prior to visit.    She has No Known Allergies..  Review of Systems Review of Systems  Constitutional: Negative for activity change, appetite change and fatigue.  HENT: Negative for hearing loss, congestion, tinnitus and ear discharge.  dentist q63m Eyes: Negative  for visual disturbance (see optho q1y -- vision corrected to 20/20 with glasses).  Respiratory: Negative for cough, chest tightness and shortness of breath.   Cardiovascular: Negative for chest pain, palpitations and leg swelling.  Gastrointestinal: Negative for abdominal pain, diarrhea, constipation and abdominal distention.  Genitourinary: Negative for urgency, frequency, decreased urine volume and difficulty urinating.  Musculoskeletal: Negative for back pain, arthralgias and gait problem.  Skin: Negative for color change, pallor and rash.  Neurological: Negative for dizziness, light-headedness, numbness and headaches.  Hematological: Negative for adenopathy. Does not  bruise/bleed easily.  Psychiatric/Behavioral: Negative for suicidal ideas, confusion, sleep disturbance, self-injury, dysphoric mood, decreased concentration and agitation.       Objective:       BP 110/68 (BP Location: Right Arm, Patient Position: Sitting, Cuff Size: Large)   Pulse 69   Temp (!) 97.1 F (36.2 C) (Temporal)   Resp 18   Ht 5' 1.5" (1.562 m)   Wt 187 lb 3.2 oz (84.9 kg)   SpO2 98%   BMI 34.80 kg/m  General appearance: alert, cooperative, appears stated age and no distress Head: Normocephalic, without obvious abnormality, atraumatic Eyes: conjunctivae/corneas clear. PERRL, EOM's intact. Fundi benign. Ears: normal TM's and external ear canals both ears Nose: Nares normal. Septum midline. Mucosa normal. No drainage or sinus tenderness. Throat: lips, mucosa, and tongue normal; teeth and gums normal Neck: no adenopathy, no carotid bruit, no JVD, supple, symmetrical, trachea midline and thyroid not enlarged, symmetric, no tenderness/mass/nodules Back: symmetric, no curvature. ROM normal. No CVA tenderness. Lungs: clear to auscultation bilaterally Breasts: normal appearance, no masses or tenderness Heart: regular rate and rhythm, S1, S2 normal, no murmur, click, rub or gallop Abdomen: soft, non-tender; bowel sounds normal; no masses,  no organomegaly Pelvic: deferred Extremities: extremities normal, atraumatic, no cyanosis or edema Pulses: 2+ and symmetric Skin: Skin color, texture, turgor normal. No rashes or lesions Lymph nodes: Cervical, supraclavicular, and axillary nodes normal. Neurologic: Alert and oriented X 3, normal strength and tone. Normal symmetric reflexes. Normal coordination and gait    Assessment:    Healthy female exam.      Plan:    ghm utd Check labs  See After Visit Summary for Counseling Recommendations

## 2018-11-12 NOTE — Patient Instructions (Signed)

## 2018-11-14 LAB — ROCKY MTN SPOTTED FVR AB, IGM-BLOOD: RMSF IgM: 0.16 index (ref 0.00–0.89)

## 2018-11-15 ENCOUNTER — Other Ambulatory Visit: Payer: Self-pay | Admitting: Family Medicine

## 2018-11-15 DIAGNOSIS — W57XXXA Bitten or stung by nonvenomous insect and other nonvenomous arthropods, initial encounter: Secondary | ICD-10-CM

## 2018-11-15 MED ORDER — DOXYCYCLINE HYCLATE 100 MG PO TABS: 100.0000 mg | ORAL_TABLET | Freq: Two times a day (BID) | ORAL | 0 refills | Status: DC

## 2018-11-17 ENCOUNTER — Encounter: Payer: Self-pay | Admitting: Family Medicine

## 2018-11-17 LAB — B. BURGDORFI ANTIBODIES BY WB

## 2018-11-17 LAB — LYME AB SCREEN %: Lyme AB Screen: 2.21 index — ABNORMAL HIGH

## 2019-04-27 ENCOUNTER — Encounter: Payer: Self-pay | Admitting: Family Medicine

## 2019-04-28 ENCOUNTER — Encounter: Payer: Self-pay | Admitting: Family Medicine

## 2019-04-28 ENCOUNTER — Other Ambulatory Visit: Payer: Self-pay

## 2019-04-28 ENCOUNTER — Ambulatory Visit (INDEPENDENT_AMBULATORY_CARE_PROVIDER_SITE_OTHER): Payer: 59 | Admitting: Family Medicine

## 2019-04-28 ENCOUNTER — Ambulatory Visit (HOSPITAL_BASED_OUTPATIENT_CLINIC_OR_DEPARTMENT_OTHER)
Admission: RE | Admit: 2019-04-28 | Discharge: 2019-04-28 | Disposition: A | Discharge status: Home / Self Care | Payer: 59 | Source: Ambulatory Visit | Attending: Family Medicine | Admitting: Family Medicine

## 2019-04-28 ENCOUNTER — Other Ambulatory Visit: Payer: Self-pay | Admitting: Family Medicine

## 2019-04-28 VITALS — HR 70 | Temp 97.9°F | Ht 61.5 in | Wt 190.0 lb

## 2019-04-28 DIAGNOSIS — M25512 Pain in left shoulder: Secondary | ICD-10-CM

## 2019-04-28 DIAGNOSIS — G8929 Other chronic pain: Secondary | ICD-10-CM | POA: Insufficient documentation

## 2019-04-28 DIAGNOSIS — Z8619 Personal history of other infectious and parasitic diseases: Secondary | ICD-10-CM | POA: Diagnosis not present

## 2019-04-28 DIAGNOSIS — M25511 Pain in right shoulder: Secondary | ICD-10-CM

## 2019-04-28 LAB — CBC WITH DIFFERENTIAL/PLATELET
Basophils Absolute: 0.1 10*3/uL (ref 0.0–0.1)
Basophils Relative: 1 % (ref 0.0–3.0)
Eosinophils Absolute: 0.2 10*3/uL (ref 0.0–0.7)
Eosinophils Relative: 4.3 % (ref 0.0–5.0)
HCT: 39.2 % (ref 36.0–46.0)
Hemoglobin: 13.3 g/dL (ref 12.0–15.0)
Lymphocytes Relative: 40.1 % (ref 12.0–46.0)
Lymphs Abs: 2.2 10*3/uL (ref 0.7–4.0)
MCHC: 33.9 g/dL (ref 30.0–36.0)
MCV: 96.4 fl (ref 78.0–100.0)
Monocytes Absolute: 0.3 10*3/uL (ref 0.1–1.0)
Monocytes Relative: 6.1 % (ref 3.0–12.0)
Neutro Abs: 2.7 10*3/uL (ref 1.4–7.7)
Neutrophils Relative %: 48.5 % (ref 43.0–77.0)
Platelets: 301 10*3/uL (ref 150.0–400.0)
RBC: 4.07 Mil/uL (ref 3.87–5.11)
RDW: 12.5 % (ref 11.5–15.5)
WBC: 5.5 10*3/uL (ref 4.0–10.5)

## 2019-04-28 LAB — COMPREHENSIVE METABOLIC PANEL
ALT: 12 U/L (ref 0–35)
AST: 13 U/L (ref 0–37)
Albumin: 4.2 g/dL (ref 3.5–5.2)
Alkaline Phosphatase: 69 U/L (ref 39–117)
BUN: 15 mg/dL (ref 6–23)
CO2: 30 mEq/L (ref 19–32)
Calcium: 8.7 mg/dL (ref 8.4–10.5)
Chloride: 103 mEq/L (ref 96–112)
Creatinine, Ser: 0.9 mg/dL (ref 0.40–1.20)
GFR: 64.85 mL/min (ref >60.00)
Glucose, Bld: 67 mg/dL — ABNORMAL LOW (ref 70–99)
Potassium: 4.1 mEq/L (ref 3.5–5.1)
Sodium: 137 mEq/L (ref 135–145)
Total Bilirubin: 0.6 mg/dL (ref 0.2–1.2)
Total Protein: 6.7 g/dL (ref 6.0–8.3)

## 2019-04-28 LAB — SEDIMENTATION RATE: Sed Rate: 1 mm/hr (ref 0–30)

## 2019-04-28 LAB — VITAMIN D 25 HYDROXY (VIT D DEFICIENCY, FRACTURES): VITD: 36.25 ng/mL (ref 30.00–100.00)

## 2019-04-28 NOTE — Progress Notes (Signed)
Virtual Visit via Video Note  I connected with Julie Gibbs on 04/28/19 at  9:00 AM EST by a video enabled telemedicine application and verified that I am speaking with the correct person using two identifiers.  Location: Patient: home alone Provider: home    I discussed the limitations of evaluation and management by telemedicine and the availability of in person appointments. The patient expressed understanding and agreed to proceed.  History of Present Illness: Pt is home c/o b/l shoulder pain and elbow pain  She is concerned about the lyme dz found this past summer but she also had a bad experience with a breast bx and a marker was put in and she has had pain since She also has hx of calcific tendonitis   Observations/Objective: Today's Vitals   04/28/19 0841  Pulse: 70  Temp: 97.9 F (36.6 C)  Weight: 190 lb (86.2 kg)  Height: 5' 1.5" (1.562 m)   Body mass index is 35.32 kg/m. Pt is in NAD Pt has full rom of L shoulder --- pain lat elbow as well.    Assessment and Plan: 1. Chronic pain of both shoulders Xray today and labs Consider pred taper vs sport med  - CBC with Differential/Platelet; Future - Comprehensive metabolic panel; Future - Vitamin D (25 hydroxy); Future - Antinuclear Antib (ANA); Future - Rheumatoid Factor; Future - Sedimentation rate; Future - DG Shoulder Left; Future  2. History of Lyme disease Pt requesting repeat labs  - B. burgdorfi antibodies   Follow Up Instructions:    I discussed the assessment and treatment plan with the patient. The patient was provided an opportunity to ask questions and all were answered. The patient agreed with the plan and demonstrated an understanding of the instructions.   The patient was advised to call back or seek an in-person evaluation if the symptoms worsen or if the condition fails to improve as anticipated.  I provided 30 minutes of non-face-to-face time during this encounter.-- reviewed chart ,  discussing plan    Donato Schultz, DO

## 2019-04-28 NOTE — Telephone Encounter (Signed)
We can give her the information for [facility] in [place]--- she will need to have her records from [facility] forwarded there---- she can probably sign a release with [facility] so she does not have to go to [facility]

## 2019-04-28 NOTE — Addendum Note (Signed)
Addended by: Harley Alto on: 04/28/2019 10:10 AM   Modules accepted: Orders

## 2019-04-29 LAB — ANA: Anti Nuclear Antibody (ANA): NEGATIVE

## 2019-04-29 LAB — RHEUMATOID FACTOR: Rhuematoid fact SerPl-aCnc: <14 IU/mL (ref <14)

## 2019-04-30 ENCOUNTER — Encounter: Payer: Self-pay | Admitting: Family Medicine

## 2019-04-30 ENCOUNTER — Other Ambulatory Visit: Payer: Self-pay | Admitting: Family Medicine

## 2019-04-30 DIAGNOSIS — E559 Vitamin D deficiency, unspecified: Secondary | ICD-10-CM

## 2019-04-30 NOTE — Telephone Encounter (Signed)
Vita d 50,000u weekly #12  con't otc D3 Recheck 3 months

## 2019-05-03 ENCOUNTER — Other Ambulatory Visit: Payer: Self-pay | Admitting: Family Medicine

## 2019-05-03 DIAGNOSIS — E559 Vitamin D deficiency, unspecified: Secondary | ICD-10-CM

## 2019-05-03 LAB — LYME AB SCREEN %: Lyme AB Screen: 0.92 index — ABNORMAL HIGH

## 2019-05-03 LAB — B. BURGDORFI ANTIBODIES BY WB

## 2019-05-03 MED ORDER — VITAMIN D (ERGOCALCIFEROL) 1.25 MG (50000 UNIT) PO CAPS: 50000.0000 [IU] | ORAL_CAPSULE | ORAL | 0 refills | Status: DC

## 2019-05-08 ENCOUNTER — Other Ambulatory Visit: Payer: Self-pay

## 2019-05-08 ENCOUNTER — Ambulatory Visit (HOSPITAL_BASED_OUTPATIENT_CLINIC_OR_DEPARTMENT_OTHER)
Admission: RE | Admit: 2019-05-08 | Discharge: 2019-05-08 | Disposition: A | Discharge status: Home / Self Care | Payer: 59 | Source: Ambulatory Visit | Attending: Family Medicine | Admitting: Family Medicine

## 2019-05-08 DIAGNOSIS — M25512 Pain in left shoulder: Secondary | ICD-10-CM | POA: Insufficient documentation

## 2019-05-08 DIAGNOSIS — G8929 Other chronic pain: Secondary | ICD-10-CM | POA: Diagnosis present

## 2019-05-10 ENCOUNTER — Other Ambulatory Visit: Payer: Self-pay | Admitting: Family Medicine

## 2019-05-10 DIAGNOSIS — M75110 Incomplete rotator cuff tear or rupture of unspecified shoulder, not specified as traumatic: Secondary | ICD-10-CM

## 2019-05-12 ENCOUNTER — Ambulatory Visit (INDEPENDENT_AMBULATORY_CARE_PROVIDER_SITE_OTHER): Payer: 59 | Admitting: Orthopaedic Surgery

## 2019-05-12 ENCOUNTER — Encounter: Payer: Self-pay | Admitting: Orthopaedic Surgery

## 2019-05-12 ENCOUNTER — Other Ambulatory Visit: Payer: Self-pay

## 2019-05-12 DIAGNOSIS — M25512 Pain in left shoulder: Secondary | ICD-10-CM

## 2019-05-12 DIAGNOSIS — G8929 Other chronic pain: Secondary | ICD-10-CM | POA: Diagnosis not present

## 2019-05-12 MED ORDER — LIDOCAINE HCL 1 % IJ SOLN
3.0000 mL | INTRAMUSCULAR | Status: AC | PRN
  Administered 2019-05-12: 16:00:00 3 mL

## 2019-05-12 MED ORDER — METHYLPREDNISOLONE ACETATE 40 MG/ML IJ SUSP
40.0000 mg | INTRAMUSCULAR | Status: AC | PRN
  Administered 2019-05-12: 16:00:00 40 mg via INTRA_ARTICULAR

## 2019-05-12 NOTE — Progress Notes (Signed)
Office Visit Note   Patient: Julie Gibbs           Date of Birth: [birth year]           MRN: [id number] Visit Date: 05/12/2019              Requested by: [address] [place], [place] [address] [place],  [place] [address] PCP: Carollee Herter, Alferd Apa, DO   Assessment & Plan: Visit Diagnoses:  1. Chronic left shoulder pain     Plan: Obviously she wants to stay conservative and I agree with this treatment plan as well.  She has not had any type of intervention in that left shoulder in terms of any type of injection.  I felt it was appropriate to try a steroid injection in her left shoulder subacromial space today and have Dr. Junius Roads see her next week as a consultation for an ultrasound-guided steroid injection in the left [facility] joint.  She agrees with this treatment plan.  She understands the risk and benefits of steroid injections.  She does not need any formal physical therapy because her range of motion is excellent and she has good strength.  She did tolerate the steroid injection well today.  She will see Dr. Junius Roads next week and then he can send her back to me about 3 weeks later.  Follow-Up Instructions: Return in about 1 week (around 05/19/2019).   Orders:  Orders Placed This Encounter  Procedures  . Large Joint Inj   No orders of the defined types were placed in this encounter.     Procedures: Large Joint Inj: L subacromial bursa on 05/12/2019 3:56 PM Indications: pain and diagnostic evaluation Details: 22 G 1.5 in needle  Arthrogram: No  Medications: 3 mL lidocaine 1 %; 40 mg methylPREDNISolone acetate 40 MG/ML Outcome: tolerated well, no immediate complications Procedure, treatment alternatives, risks and benefits explained, specific risks discussed. Consent was given by the patient. Immediately prior to procedure a time out was called to verify the correct patient, procedure, equipment, support staff and site/side marked as required. Patient was prepped and draped  in the usual sterile fashion.       Clinical Data: No additional findings.   Subjective: Chief Complaint  Patient presents with  . Left Shoulder - Pain  The patient is a very pleasant 54 year old female who comes in for evaluation treatment of left shoulder pain.  She actually has a MRI that accompanies her.  I am able to pull it out from the canopy system.  She says it mainly hurts with overhead activities and reaching behind her.  She denies any injuries.  Is been getting slowly worse for several months.  At least 6 months to a year.  She does have a history of calcific tendinitis of her right shoulder that cleared up with an injection.  She is a Economist.  She also is avid with her yoga.  She denies any numbness and tingling in her neck but does report occasionally burning sensation across her chest to her shoulder and down her arm.  She denies any shortness of breath  HPI  Review of Systems. She currently denies any formal chest pain, shortness of breath, fever, chills, nausea, vomiting  Objective: Vital Signs: There were no vitals taken for this visit.  Physical Exam She is alert and orient x3 and in no acute distress Ortho Exam Examination of her left shoulder does show pain at the [facility] joint and subacromial  outlet with positive Neer and Hawkins signs.  Her range of motion is excellent and full.  Her liftoff is negative.  She does show excellent strength of the cuff itself. Specialty Comments:  No specialty comments available.  Imaging: No results found. The MRI of her shoulder is reviewed with her.  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  There is also fluid in the subacromial subdeltoid bursa.  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  There is evidence of calcific tendinitis as well.  PMFS History: Patient Active Problem List   Diagnosis Date Noted  . Abdominal pain in female 10/13/2016  . Preventative health care 04/03/2016   Past  Medical History:  Diagnosis Date  . Blackout spell    per pt/sedation can cause drop in B/P and blackout spell  . 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 Ataxia Mother   . Other Mother        Hereditary Vitamin E Deficiency  . Cancer Father        Neuroendocrine CA  . Mental retardation Maternal Grandfather   . Coronary artery disease Paternal Grandfather   . Suicidality Paternal Grandfather   . Heart disease Paternal Grandmother   . Heart failure Paternal Grandmother     Past Surgical History:  Procedure Laterality Date  . ABDOMINAL HYSTERECTOMY  07/1992  . ABDOMINOPLASTY  "About 5 years ago"  . TONSILLECTOMY  Kindergarten   Social History   Occupational History  . Not on file  Tobacco Use  . Smoking status: Never Smoker  . Smokeless tobacco: Never Used  Substance and Sexual Activity  . Alcohol use: Yes    Alcohol/week: 1.0 - 2.0 standard drinks    Types: 1 - 2 Glasses of wine per week  . Drug use: No  . Sexual activity: Yes

## 2019-05-21 ENCOUNTER — Ambulatory Visit: Payer: Self-pay

## 2019-05-21 ENCOUNTER — Encounter: Payer: Self-pay | Admitting: Family Medicine

## 2019-05-21 ENCOUNTER — Ambulatory Visit (INDEPENDENT_AMBULATORY_CARE_PROVIDER_SITE_OTHER): Payer: 59 | Admitting: Family Medicine

## 2019-05-21 ENCOUNTER — Other Ambulatory Visit: Payer: Self-pay

## 2019-05-21 DIAGNOSIS — M25512 Pain in left shoulder: Secondary | ICD-10-CM

## 2019-05-21 DIAGNOSIS — G8929 Other chronic pain: Secondary | ICD-10-CM

## 2019-05-21 NOTE — Progress Notes (Signed)
Subjective: She is here for a planned left shoulder ultrasound-guided AC joint injection.  She had very good relief from her subacromial injection, she cannot sleep at night.  She is very pleased with her results.  She still has a Cabin crew" in her shoulder when she reaches overhead.  Objective: She is tender to palpation over the [facility] joint and has a positive AC crossover test.  Procedure: Ultrasound-guided left AC joint injection: After sterile prep with Betadine, injected 4 cc 1% lidocaine without epinephrine and 40 mg methylprednisolone into the [facility] joint using ultrasound to guide needle placement.  She had good relief during the immediate anesthetic phase.

## 2019-07-23 ENCOUNTER — Other Ambulatory Visit: Payer: Self-pay

## 2019-07-23 NOTE — Telephone Encounter (Signed)
Received fax from pharmacy requesting refill on Vitamin D. Patient needs appointment to recheck vitamin d level.

## 2019-08-02 ENCOUNTER — Other Ambulatory Visit: Payer: Self-pay

## 2019-08-02 ENCOUNTER — Other Ambulatory Visit (INDEPENDENT_AMBULATORY_CARE_PROVIDER_SITE_OTHER): Payer: 59

## 2019-08-02 DIAGNOSIS — E559 Vitamin D deficiency, unspecified: Secondary | ICD-10-CM

## 2019-08-02 LAB — VITAMIN D 25 HYDROXY (VIT D DEFICIENCY, FRACTURES): VITD: 61.25 ng/mL (ref 30.00–100.00)

## 2019-11-15 ENCOUNTER — Ambulatory Visit (INDEPENDENT_AMBULATORY_CARE_PROVIDER_SITE_OTHER): Payer: 59 | Admitting: Family Medicine

## 2019-11-15 ENCOUNTER — Encounter: Payer: Self-pay | Admitting: Family Medicine

## 2019-11-15 ENCOUNTER — Other Ambulatory Visit: Payer: Self-pay

## 2019-11-15 VITALS — BP 108/60 | HR 68 | Temp 99.0°F | Resp 18 | Ht 61.6 in | Wt 196.0 lb

## 2019-11-15 DIAGNOSIS — Z0000 Encounter for general adult medical examination without abnormal findings: Secondary | ICD-10-CM | POA: Diagnosis not present

## 2019-11-15 NOTE — Patient Instructions (Signed)

## 2019-11-15 NOTE — Progress Notes (Signed)
Subjective:     Julie Gibbs is a 54 y.o. female and is here for a comprehensive physical exam. The patient reports no problems.  Social History   Socioeconomic History  . Marital status: Married    Spouse name: Not on file  . Number of children: Not on file  . Years of education: Not on file  . Highest education level: Not on file  Occupational History  . Not on file  Tobacco Use  . Smoking status: Never Smoker  . Smokeless tobacco: Never Used  Substance and Sexual Activity  . Alcohol use: Yes    Alcohol/week: 1.0 - 2.0 standard drink    Types: 1 - 2 Glasses of wine per week  . Drug use: No  . Sexual activity: Yes  Other Topics Concern  . Not on file  Social History Narrative  . Not on file   Social Determinants of Health   Financial Resource Strain:   . Difficulty of Paying Living Expenses: Not on file  Food Insecurity:   . Worried About Programme researcher, broadcasting/film/video in the Last Year: Not on file  . Ran Out of Food in the Last Year: Not on file  Transportation Needs:   . Lack of Transportation (Medical): Not on file  . Lack of Transportation (Non-Medical): Not on file  Physical Activity:   . Days of Exercise per Week: Not on file  . Minutes of Exercise per Session: Not on file  Stress:   . Feeling of Stress : Not on file  Social Connections:   . Frequency of Communication with Friends and Family: Not on file  . Frequency of Social Gatherings with Friends and Family: Not on file  . Attends Religious Services: Not on file  . Active Member of Clubs or Organizations: Not on file  . Attends Banker Meetings: Not on file  . Marital Status: Not on file  Intimate Partner Violence:   . Fear of Current or Ex-Partner: Not on file  . Emotionally Abused: Not on file  . Physically Abused: Not on file  . Sexually Abused: Not on file   Health Maintenance  Topic Date Due  . COVID-19 Vaccine (1) Never done  . PAP SMEAR-Modifier  04/02/2019  . INFLUENZA VACCINE   06/30/2023 (Originally 10/17/2019)  . TETANUS/TDAP  11/11/2023 (Originally 09/05/1982)  . MAMMOGRAM  08/26/2020  . COLONOSCOPY  06/26/2025  . Hepatitis C Screening  Completed  . HIV Screening  Completed    The following portions of the patient's history were reviewed and updated as appropriate:  She  has a past medical history of Blackout spell and Granuloma annulare. She does not have any pertinent problems on file. She  has a past surgical history that includes Abdominal hysterectomy (07/1992); Tonsillectomy (Kindergarten); and Abdominoplasty ("About 5 years ago"). Her family history includes Ataxia in her mother; Cancer in her father; Coronary artery disease in her paternal grandfather; Heart disease in her paternal grandmother; Heart failure in her paternal grandmother; Mental retardation in her maternal grandfather; Other in her mother; Suicidality in her paternal grandfather. She  reports that she has never smoked. She has never used smokeless tobacco. She reports current alcohol use of about 1.0 - 2.0 standard drink of alcohol per week. She reports that she does not use drugs. She currently has no medications in their medication list. No current outpatient medications on file prior to visit.   No current facility-administered medications on file prior to visit.   She has  No Known Allergies..  Review of Systems Review of Systems  Constitutional: Negative for activity change, appetite change and fatigue.  HENT: Negative for hearing loss, congestion, tinnitus and ear discharge.  dentist q29m Eyes: Negative for visual disturbance (see optho q1y -- vision corrected to 20/20 with glasses).  Respiratory: Negative for cough, chest tightness and shortness of breath.   Cardiovascular: Negative for chest pain, palpitations and leg swelling.  Gastrointestinal: Negative for abdominal pain, diarrhea, constipation and abdominal distention.  Genitourinary: Negative for urgency, frequency, decreased  urine volume and difficulty urinating.  Musculoskeletal: Negative for back pain, arthralgias and gait problem.  Skin: Negative for color change, pallor and rash.  Neurological: Negative for dizziness, light-headedness, numbness and headaches.  Hematological: Negative for adenopathy. Does not bruise/bleed easily.  Psychiatric/Behavioral: Negative for suicidal ideas, confusion, sleep disturbance, self-injury, dysphoric mood, decreased concentration and agitation.       Objective:    BP 108/60 (BP Location: Right Arm, Patient Position: Sitting, Cuff Size: Large)   Pulse 68   Temp 99 F (37.2 C) (Oral)   Resp 18   Ht 5' 1.6" (1.565 m)   Wt 196 lb (88.9 kg)   SpO2 98%   BMI 36.32 kg/m  General appearance: alert, cooperative, appears stated age and no distress Head: Normocephalic, without obvious abnormality, atraumatic Eyes: negative findings: lids and lashes normal, conjunctivae and sclerae normal and pupils equal, round, reactive to light and accomodation Ears: normal TM's and external ear canals both ears Neck: no adenopathy, no carotid bruit, no JVD, supple, symmetrical, trachea midline and thyroid not enlarged, symmetric, no tenderness/mass/nodules Back: symmetric, no curvature. ROM normal. No CVA tenderness. Lungs: clear to auscultation bilaterally Breasts: normal appearance, no masses or tenderness Heart: regular rate and rhythm, S1, S2 normal, no murmur, click, rub or gallop Abdomen: soft, non-tender; bowel sounds normal; no masses,  no organomegaly Pelvic: not indicated; status post hysterectomy, negative ROS Extremities: extremities normal, atraumatic, no cyanosis or edema Pulses: 2+ and symmetric Skin: Skin color, texture, turgor normal. No rashes or lesions Lymph nodes: Cervical, supraclavicular, and axillary nodes normal. Neurologic: Alert and oriented X 3, normal strength and tone. Normal symmetric reflexes. Normal coordination and gait    Assessment:    Healthy  female exam.      Plan:    ghm utd Check labs  See After Visit Summary for Counseling Recommendations    1. Preventative health care See above - TSH - Lipid panel - CBC with Differential/Platelet - Comprehensive metabolic panel

## 2019-11-16 LAB — CBC WITH DIFFERENTIAL/PLATELET
Absolute Monocytes: 383 cells/uL (ref 200–950)
Basophils Absolute: 41 cells/uL (ref 0–200)
Basophils Relative: 0.7 %
Eosinophils Absolute: 93 cells/uL (ref 15–500)
Eosinophils Relative: 1.6 %
HCT: 39.6 % (ref 35.0–45.0)
Hemoglobin: 13.3 g/dL (ref 11.7–15.5)
Lymphs Abs: 2152 cells/uL (ref 850–3900)
MCH: 32.1 pg (ref 27.0–33.0)
MCHC: 33.6 g/dL (ref 32.0–36.0)
MCV: 95.7 fL (ref 80.0–100.0)
MPV: 9.6 fL (ref 7.5–12.5)
Monocytes Relative: 6.6 %
Neutro Abs: 3132 cells/uL (ref 1500–7800)
Neutrophils Relative %: 54 %
Platelets: 301 10*3/uL (ref 140–400)
RBC: 4.14 10*6/uL (ref 3.80–5.10)
RDW: 11.8 % (ref 11.0–15.0)
Total Lymphocyte: 37.1 %
WBC: 5.8 10*3/uL (ref 3.8–10.8)

## 2019-11-16 LAB — LIPID PANEL
Cholesterol: 194 mg/dL (ref <200)
HDL: 61 mg/dL (ref >=50)
LDL Cholesterol (Calc): 114 mg/dL (calc) — ABNORMAL HIGH
Non-HDL Cholesterol (Calc): 133 mg/dL (calc) — ABNORMAL HIGH (ref <130)
Total CHOL/HDL Ratio: 3.2 (calc) (ref <5.0)
Triglycerides: 88 mg/dL (ref <150)

## 2019-11-16 LAB — COMPREHENSIVE METABOLIC PANEL
AG Ratio: 1.8 (calc) (ref 1.0–2.5)
ALT: 12 U/L (ref 6–29)
AST: 15 U/L (ref 10–35)
Albumin: 4.4 g/dL (ref 3.6–5.1)
Alkaline phosphatase (APISO): 71 U/L (ref 37–153)
BUN: 19 mg/dL (ref 7–25)
CO2: 28 mmol/L (ref 20–32)
Calcium: 9 mg/dL (ref 8.6–10.4)
Chloride: 104 mmol/L (ref 98–110)
Creat: 0.8 mg/dL (ref 0.50–1.05)
Globulin: 2.4 g/dL (calc) (ref 1.9–3.7)
Glucose, Bld: 90 mg/dL (ref 65–99)
Potassium: 4.7 mmol/L (ref 3.5–5.3)
Sodium: 139 mmol/L (ref 135–146)
Total Bilirubin: 0.6 mg/dL (ref 0.2–1.2)
Total Protein: 6.8 g/dL (ref 6.1–8.1)

## 2019-11-16 LAB — TSH: TSH: 2.23 mIU/L (ref 0.40–4.50)

## 2020-02-20 ENCOUNTER — Other Ambulatory Visit: Payer: Self-pay

## 2020-02-20 ENCOUNTER — Encounter (HOSPITAL_BASED_OUTPATIENT_CLINIC_OR_DEPARTMENT_OTHER): Payer: Self-pay | Admitting: Emergency Medicine

## 2020-02-20 ENCOUNTER — Emergency Department (HOSPITAL_BASED_OUTPATIENT_CLINIC_OR_DEPARTMENT_OTHER)
Admission: EM | Admit: 2020-02-20 | Discharge: 2020-02-21 | Disposition: A | Discharge status: Home / Self Care | Payer: 59 | Attending: Emergency Medicine | Admitting: Emergency Medicine

## 2020-02-20 DIAGNOSIS — Z20822 Contact with and (suspected) exposure to covid-19: Secondary | ICD-10-CM | POA: Insufficient documentation

## 2020-02-20 DIAGNOSIS — R112 Nausea with vomiting, unspecified: Secondary | ICD-10-CM | POA: Diagnosis present

## 2020-02-20 DIAGNOSIS — R000 Tachycardia, unspecified: Secondary | ICD-10-CM | POA: Insufficient documentation

## 2020-02-20 DIAGNOSIS — J069 Acute upper respiratory infection, unspecified: Secondary | ICD-10-CM | POA: Diagnosis not present

## 2020-02-20 MED ORDER — SODIUM CHLORIDE 0.9 % IV BOLUS
1000.0000 mL | Freq: Once | INTRAVENOUS | Status: AC
  Administered 2020-02-21: 1000 mL via INTRAVENOUS

## 2020-02-20 MED ORDER — ONDANSETRON HCL 4 MG/2ML IJ SOLN
4.0000 mg | Freq: Once | INTRAMUSCULAR | Status: AC
  Administered 2020-02-21: 4 mg via INTRAVENOUS
  Filled 2020-02-20: qty 2

## 2020-02-20 NOTE — ED Triage Notes (Signed)
Reports cold symptoms for the last week.  Took two rapid test last week that were negative.  Reports after napping today woke up with sinus pressure.  Took theraflu.  Since reports elevated bp and low temperature.  Actively vomiting in triage.  Reports it started this afternoon.

## 2020-02-21 ENCOUNTER — Emergency Department (HOSPITAL_BASED_OUTPATIENT_CLINIC_OR_DEPARTMENT_OTHER): Payer: 59

## 2020-02-21 LAB — CBC WITH DIFFERENTIAL/PLATELET
Abs Immature Granulocytes: 0.04 10*3/uL (ref 0.00–0.07)
Basophils Absolute: 0 10*3/uL (ref 0.0–0.1)
Basophils Relative: 0 %
Eosinophils Absolute: 0 10*3/uL (ref 0.0–0.5)
Eosinophils Relative: 0 %
HCT: 37.8 % (ref 36.0–46.0)
Hemoglobin: 13 g/dL (ref 12.0–15.0)
Immature Granulocytes: 0 %
Lymphocytes Relative: 15 %
Lymphs Abs: 1.6 10*3/uL (ref 0.7–4.0)
MCH: 32.3 pg (ref 26.0–34.0)
MCHC: 34.4 g/dL (ref 30.0–36.0)
MCV: 94 fL (ref 80.0–100.0)
Monocytes Absolute: 0.3 10*3/uL (ref 0.1–1.0)
Monocytes Relative: 3 %
Neutro Abs: 8.4 10*3/uL — ABNORMAL HIGH (ref 1.7–7.7)
Neutrophils Relative %: 82 %
Platelets: 273 10*3/uL (ref 150–400)
RBC: 4.02 MIL/uL (ref 3.87–5.11)
RDW: 11.6 % (ref 11.5–15.5)
WBC: 10.3 10*3/uL (ref 4.0–10.5)
nRBC: 0 % (ref 0.0–0.2)

## 2020-02-21 LAB — BASIC METABOLIC PANEL
Anion gap: 8 (ref 5–15)
BUN: 14 mg/dL (ref 6–20)
CO2: 28 mmol/L (ref 22–32)
Calcium: 8.8 mg/dL — ABNORMAL LOW (ref 8.9–10.3)
Chloride: 100 mmol/L (ref 98–111)
Creatinine, Ser: 0.87 mg/dL (ref 0.44–1.00)
GFR, Estimated: >60 mL/min (ref >60)
Glucose, Bld: 143 mg/dL — ABNORMAL HIGH (ref 70–99)
Potassium: 4.1 mmol/L (ref 3.5–5.1)
Sodium: 136 mmol/L (ref 135–145)

## 2020-02-21 LAB — URINALYSIS, ROUTINE W REFLEX MICROSCOPIC
Bilirubin Urine: NEGATIVE
Glucose, UA: NEGATIVE mg/dL
Hgb urine dipstick: NEGATIVE
Ketones, ur: NEGATIVE mg/dL
Leukocytes,Ua: NEGATIVE
Nitrite: NEGATIVE
Protein, ur: NEGATIVE mg/dL
Specific Gravity, Urine: 1.015 (ref 1.005–1.030)
pH: 7.5 (ref 5.0–8.0)

## 2020-02-21 LAB — RESP PANEL BY RT-PCR (FLU A&B, COVID) ARPGX2
Influenza A by PCR: NEGATIVE
Influenza B by PCR: NEGATIVE
SARS Coronavirus 2 by RT PCR: NEGATIVE

## 2020-02-21 LAB — HEPATIC FUNCTION PANEL
ALT: 19 U/L (ref 0–44)
AST: 19 U/L (ref 15–41)
Albumin: 3.7 g/dL (ref 3.5–5.0)
Alkaline Phosphatase: 73 U/L (ref 38–126)
Bilirubin, Direct: 0.1 mg/dL (ref 0.0–0.2)
Indirect Bilirubin: 0.2 mg/dL — ABNORMAL LOW (ref 0.3–0.9)
Total Bilirubin: 0.3 mg/dL (ref 0.3–1.2)
Total Protein: 6.6 g/dL (ref 6.5–8.1)

## 2020-02-21 LAB — LIPASE, BLOOD: Lipase: 29 U/L (ref 11–51)

## 2020-02-21 MED ORDER — ONDANSETRON 4 MG PO TBDP: 4.0000 mg | ORAL_TABLET | Freq: Three times a day (TID) | ORAL | 0 refills | Status: DC | PRN

## 2020-02-21 NOTE — Discharge Instructions (Addendum)
You were seen today for upper respiratory symptoms and nausea and vomiting.  Your work-up is reassuring.  Make sure that you're staying hydrated at home.  Take Zofran as needed for nausea.  Your Covid and flu testing are negative.

## 2020-02-21 NOTE — ED Provider Notes (Signed)
[facility] EMERGENCY DEPARTMENT Provider Note   CSN: [id number] Arrival date & time: 02/20/20  2237     History Chief Complaint  Patient presents with  . URI  . Emesis    Julie Gibbs is a 54 y.o. female.  HPI     This a 54 year old female with no reported past medical history who presents with upper respiratory symptoms and nausea and vomiting.  Patient reports she has had a 1 week history of upper respiratory and cold symptoms.  She reports 2 - Covid test.  She is not vaccinated against COVID-19.  She states that she woke up from a nap and had some sinus congestion.  She took a TheraFlu.  She reports that "everything went crazy after that.  She reports high blood pressures and a low temperature.  She also reports nonbilious, nonbloody emesis.  No diarrhea.  She states that she was febrile last week but has not had any recurrent fevers.  Denies chest pain, shortness of breath, abdominal pain.  Past Medical History:  Diagnosis Date  . Blackout spell    per pt/sedation can cause drop in B/P and blackout spell  . 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 Abdominal pain in female 10/13/2016  . Preventative health care 04/03/2016    Past Surgical History:  Procedure Laterality Date  . ABDOMINAL HYSTERECTOMY  07/1992  . ABDOMINOPLASTY  "About 5 years ago"  . TONSILLECTOMY  Kindergarten     OB History   No obstetric history on file.     Family History  Problem Relation Age of Onset  . Ataxia Mother   . Other Mother        Hereditary Vitamin E Deficiency  . Cancer Father        Neuroendocrine CA  . Mental retardation Maternal Grandfather   . Coronary artery disease Paternal Grandfather   . Suicidality Paternal Grandfather   . Heart disease Paternal Grandmother   . Heart failure Paternal Grandmother     Social History   Tobacco Use  . Smoking status: Never Smoker  . Smokeless  tobacco: Never Used  Substance Use Topics  . Alcohol use: Yes    Alcohol/week: 1.0 - 2.0 standard drink    Types: 1 - 2 Glasses of wine per week  . Drug use: No    Home Medications Prior to Admission medications   Medication Sig Start Date End Date Taking? Authorizing Provider  ondansetron (ZOFRAN ODT) 4 MG disintegrating tablet Take 1 tablet (4 mg total) by mouth every 8 (eight) hours as needed. 02/21/20   Braylynn Lewing, Mayer Masker, MD    Allergies    Patient has no known allergies.  Review of Systems   Review of Systems  Constitutional: Positive for fever.  HENT: Positive for congestion and sinus pressure.   Respiratory: Positive for cough. Negative for shortness of breath.   Gastrointestinal: Positive for nausea and vomiting. Negative for abdominal pain, constipation and diarrhea.  Genitourinary: Negative for dysuria.  All other systems reviewed and are negative.   Physical Exam Updated Vital Signs BP 110/75   Pulse 80   Temp 97.6 F (36.4 C) (Oral)   Resp 12   Ht 1.575 m (5\' 2" )   Wt 88.5 kg   SpO2 94%   BMI 35.67 kg/m   Physical Exam Vitals and nursing note reviewed.  Constitutional:      Appearance:  She is well-developed. She is not ill-appearing.  HENT:     Head: Normocephalic and atraumatic.     Right Ear: Tympanic membrane normal.     Left Ear: Tympanic membrane normal.     Nose: Nose normal.     Mouth/Throat:     Mouth: Mucous membranes are moist.  Eyes:     Pupils: Pupils are equal, round, and reactive to light.  Cardiovascular:     Rate and Rhythm: Regular rhythm. Tachycardia present.     Heart sounds: Normal heart sounds.  Pulmonary:     Effort: Pulmonary effort is normal. No respiratory distress.     Breath sounds: No wheezing.  Abdominal:     General: Bowel sounds are normal.     Palpations: Abdomen is soft.  Musculoskeletal:     Cervical back: Neck supple.     Right lower leg: No edema.     Left lower leg: No edema.  Skin:    General: Skin is  warm and dry.  Neurological:     Mental Status: She is alert and oriented to person, place, and time.  Psychiatric:        Mood and Affect: Mood normal.     ED Results / Procedures / Treatments   Labs (all labs ordered are listed, but only abnormal results are displayed) Labs Reviewed  CBC WITH DIFFERENTIAL/PLATELET - Abnormal; Notable for the following components:      Result Value   Neutro Abs 8.4 (*)    All other components within normal limits  BASIC METABOLIC PANEL - Abnormal; Notable for the following components:   Glucose, Bld 143 (*)    Calcium 8.8 (*)    All other components within normal limits  URINALYSIS, ROUTINE W REFLEX MICROSCOPIC - Abnormal; Notable for the following components:   APPearance CLOUDY (*)    All other components within normal limits  HEPATIC FUNCTION PANEL - Abnormal; Notable for the following components:   Indirect Bilirubin 0.2 (*)    All other components within normal limits  RESP PANEL BY RT-PCR (FLU A&B, COVID) ARPGX2  LIPASE, BLOOD    EKG EKG Interpretation  Date/Time:  Monday February 21 2020 01:24:21 EST Ventricular Rate:  70 PR Interval:    QRS Duration: 97 QT Interval:  429 QTC Calculation: 463 R Axis:   31 Text Interpretation: Sinus rhythm Low voltage, precordial leads Borderline T wave abnormalities Confirmed by Ross Marcus ([id number]) on 02/21/2020 2:47:05 AM   Radiology DG Chest Portable 1 View  Result Date: 02/21/2020 CLINICAL DATA:  Cough EXAM: PORTABLE CHEST 1 VIEW COMPARISON:  None. FINDINGS: The heart size and mediastinal contours are within normal limits. Both lungs are clear. The visualized skeletal structures are unremarkable. IMPRESSION: No active disease. Electronically Signed   By: Jonna Clark M.D.   On: 02/21/2020 00:37    Procedures Procedures (including critical care time)  Medications Ordered in ED Medications  sodium chloride 0.9 % bolus 1,000 mL (0 mLs Intravenous Stopped 02/21/20 0231)  ondansetron  (ZOFRAN) injection 4 mg (4 mg Intravenous Given 02/21/20 0014)    ED Course  I have reviewed the triage vital signs and the nursing notes.  Pertinent labs & imaging results that were available during my care of the patient were reviewed by me and considered in my medical decision making (see chart for details).      MDM Rules/Calculators/A&P  Patient presents with upper respiratory symptoms.  Ongoing for last week but acutely worsening yesterday evening including onset of vomiting.  She is nontoxic-appearing.  She is slightly tachycardic my initial evaluation.  Considerations include but not limited to, COVID-19, influenza, other viral etiology, pneumonia.  Patient was given fluids and nausea medication.  Lab work obtained.  Chest x-ray obtained and shows no evidence of pneumothorax or pneumonia.  EKG without acute ischemic or arrhythmic changes.  Lab work is largely unremarkable with no significant metabolic derangements and no leukocytosis.  Urinalysis without evidence of UTI.  Covid and flu testing are negative.  On recheck, patient improved after fluids and able to tolerate oral hydration.  Recommend supportive measures.  Suspect other viral etiology as cause of patient's symptoms.  After history, exam, and medical workup I feel the patient has been appropriately medically screened and is safe for discharge home. Pertinent diagnoses were discussed with the patient. Patient was given return precautions.  Final Clinical Impression(s) / ED Diagnoses Final diagnoses:  Viral upper respiratory tract infection  Non-intractable vomiting with nausea, unspecified vomiting type    Rx / DC Orders ED Discharge Orders         Ordered    ondansetron (ZOFRAN ODT) 4 MG disintegrating tablet  Every 8 hours PRN        02/21/20 0306           Shon Baton, MD 02/21/20 [phone]

## 2020-08-28 ENCOUNTER — Encounter: Payer: Self-pay | Admitting: Family Medicine

## 2020-08-28 ENCOUNTER — Ambulatory Visit (INDEPENDENT_AMBULATORY_CARE_PROVIDER_SITE_OTHER): Payer: 59 | Admitting: Family Medicine

## 2020-08-28 ENCOUNTER — Other Ambulatory Visit: Payer: Self-pay

## 2020-08-28 VITALS — BP 114/74 | HR 87 | Temp 98.5°F | Resp 18 | Ht 62.0 in | Wt 197.0 lb

## 2020-08-28 DIAGNOSIS — R232 Flushing: Secondary | ICD-10-CM | POA: Diagnosis not present

## 2020-08-28 DIAGNOSIS — R002 Palpitations: Secondary | ICD-10-CM

## 2020-08-28 DIAGNOSIS — R140 Abdominal distension (gaseous): Secondary | ICD-10-CM

## 2020-08-28 LAB — POC URINALSYSI DIPSTICK (AUTOMATED)
Bilirubin, UA: NEGATIVE
Blood, UA: NEGATIVE
Glucose, UA: NEGATIVE
Ketones, UA: NEGATIVE
Leukocytes, UA: NEGATIVE
Nitrite, UA: NEGATIVE
Protein, UA: NEGATIVE
Spec Grav, UA: 1.02 (ref 1.010–1.025)
Urobilinogen, UA: 0.2 E.U./dL
pH, UA: 6 (ref 5.0–8.0)

## 2020-08-28 NOTE — Progress Notes (Signed)
Patient ID: Julie Gibbs, female    DOB: 11-08-1965  Age: 55 y.o. MRN: [id number]    Subjective:  Subjective  HPI Julie Gibbs presents for an office visit today. She complains of hot flashes. She notes that she had a hysterectomy at the age of 64. She reports that she had night sweats, however the hot flashes "consumes her". She states that she had a hot flash episode recently, where assistance from her husbands is required due to her weakness. She reports that the area, where she lay down was burning hot. She notes "pouring out sweats" on the day of the episode. She reports that her temp today was 22 F.  Temp Readings from Last 3 Encounters:  08/28/20 98.5 F (36.9 C) (Oral)  02/20/20 97.6 F (36.4 C) (Oral)  11/15/19 99 F (37.2 C) (Oral)  She states that she has similar symptoms when she was at the ED. Pt was at the ED on 02/20/2020 for URI and emesis. Pt was febrile x1 week before going to the ED. She also complains of anxiety secondary to the hot flashes.  She also complains of intermittent abdominal bloating. She notes being "gassy". She reports that she has PMHx of hernia, however the pain was resolved after wearing a band around her abdomen.  She denies any chest pain, SOB, fever, abdominal pain, cough, chills, sore throat, dysuria, urinary incontinence, back pain, HA, or N/V/D at this time.  Review of Systems  Constitutional:  Negative for chills, fatigue and fever.  HENT:  Negative for ear pain, rhinorrhea, sinus pressure, sinus pain, sore throat and tinnitus.   Eyes:  Negative for pain.  Respiratory:  Negative for cough, shortness of breath and wheezing.   Cardiovascular:  Negative for chest pain.  Gastrointestinal:  Negative for abdominal pain, anal bleeding, constipation, diarrhea, nausea and vomiting.       (+) bloating    Endocrine:       (+) hot flashes   Genitourinary:  Negative for flank pain.  Musculoskeletal:  Negative for back pain and neck pain.  Skin:  Negative  for rash.  Neurological:  Positive for weakness. Negative for seizures, light-headedness, numbness and headaches.  Psychiatric/Behavioral:  The patient is nervous/anxious.    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   Her family history includes Ataxia in her mother; Cancer in her father; Coronary artery disease in her paternal grandfather; Heart disease in her paternal grandmother; Heart failure in her paternal grandmother; Mental retardation in her maternal grandfather; Other in her mother; Suicidality in her paternal grandfather.She reports that she has never smoked. She has never used smokeless tobacco. She reports current alcohol use of about 1.0 - 2.0 standard drink of alcohol per week. She reports that she does not use drugs.  Current Outpatient Medications on File Prior to Visit  Medication Sig Dispense Refill   ondansetron (ZOFRAN ODT) 4 MG disintegrating tablet Take 1 tablet (4 mg total) by mouth every 8 (eight) hours as needed. (Patient not taking: Reported on 08/28/2020) 20 tablet 0   No current facility-administered medications on file prior to visit.     Objective:  Objective  Physical Exam Vitals and nursing note reviewed.  Constitutional:  General: She is not in acute distress.    Appearance: Normal appearance. She is well-developed. She is not ill-appearing.  HENT:     Head: Normocephalic and atraumatic.     Right Ear: External ear normal.     Left Ear: External ear normal.     Nose: Nose normal.  Eyes:     General:        Right eye: No discharge.        Left eye: No discharge.     Extraocular Movements: Extraocular movements intact.     Pupils: Pupils are equal, round,  and reactive to light.  Cardiovascular:     Rate and Rhythm: Normal rate and regular rhythm.     Pulses: Normal pulses.     Heart sounds: Normal heart sounds. No murmur heard.   No friction rub. No gallop.  Pulmonary:     Effort: Pulmonary effort is normal. No respiratory distress.     Breath sounds: Normal breath sounds. No stridor. No wheezing, rhonchi or rales.  Chest:     Chest wall: No tenderness.  Abdominal:     General: Bowel sounds are normal. There is no distension.     Palpations: Abdomen is soft. There is no mass.     Tenderness: There is no abdominal tenderness. There is no guarding or rebound.     Hernia: No hernia is present.  Musculoskeletal:        General: Normal range of motion.     Cervical back: Normal range of motion and neck supple.     Right lower leg: No edema.     Left lower leg: No edema.  Skin:    General: Skin is warm and dry.  Neurological:     Mental Status: She is alert and oriented to person, place, and time.  Psychiatric:        Behavior: Behavior normal.        Thought Content: Thought content normal.   BP 114/74 (BP Location: Right Arm, Patient Position: Sitting, Cuff Size: Large)   Pulse 87   Temp 98.5 F (36.9 C) (Oral)   Resp 18   Ht 5\' 2"  (1.575 m)   Wt 197 lb (89.4 kg)   SpO2 97%   BMI 36.03 kg/m  Wt Readings from Last 3 Encounters:  08/28/20 197 lb (89.4 kg)  02/20/20 195 lb (88.5 kg)  11/15/19 196 lb (88.9 kg)     Lab Results  Component Value Date   WBC 10.3 02/21/2020   HGB 13.0 02/21/2020   HCT 37.8 02/21/2020   PLT 273 02/21/2020   GLUCOSE 143 (H) 02/21/2020   CHOL 194 11/15/2019   TRIG 88 11/15/2019   HDL 61 11/15/2019   LDLCALC 114 (H) 11/15/2019   ALT 19 02/21/2020   AST 19 02/21/2020   NA 136 02/21/2020   K 4.1 02/21/2020   CL 100 02/21/2020   CREATININE 0.87 02/21/2020   BUN 14 02/21/2020   CO2 28 02/21/2020   TSH 2.23 11/15/2019    DG Chest Portable 1 View  Result Date: 02/21/2020 CLINICAL DATA:   Cough EXAM: PORTABLE CHEST 1 VIEW COMPARISON:  None. FINDINGS: The heart size and mediastinal contours are within normal limits. Both lungs are clear. The visualized skeletal structures are unremarkable. IMPRESSION: No active disease. Electronically Signed   By: 14/08/2019 M.D.   On: 02/21/2020 00:37    Ekg-- sinus , nonspecific T wave abnormality -- same 12/ 2021 Assessment & Plan:  Plan  No orders of the defined types were placed in this encounter.   Problem List Items Addressed This Visit       Unprioritized   Bloating symptom    Check [place]       Relevant Orders   US Pelvic Complete With Transvaginal   CA 125   Hot flashes - Primary    Pt does not want HRT She will try black cohosh, estrovan and Amberen Check labs       Relevant Orders   Lipid panel   CBC with Differential/Platelet   Comprehensive metabolic panel   TSH   POCT Urinalysis Dipstick (Automated) (Completed)   US Pelvic Complete With Transvaginal   Estradiol (Completed)   LH   FSH   Morbid obesity (HCC)   Relevant Orders   Amb Ref to Medical Weight Management   Palpitations   Relevant Orders   EKG 12-Lead (Completed)    Follow-up: Return in about 6 months (around 02/27/2021), or if symptoms worsen or fail to improve, for annual exam, fasting.   I,Gordon Zheng,acting as a Neurosurgeon for [employer], DO.,have documented all relevant documentation on the behalf of Donato Schultz, DO,as directed by  Donato Schultz, DO while in the presence of Donato Schultz, DO.  I, Donato Schultz, DO, have reviewed all documentation for this visit. The documentation on 08/29/20 for the exam, diagnosis, procedures, and orders are all accurate and complete.

## 2020-08-28 NOTE — Patient Instructions (Signed)
https://www.womenshealth.gov/menopause/menopause-basics"> https://www.clinicalkey.com">  Menopause Menopause is the normal time of a woman's life when menstrual periods stop completely. It marks the natural end to a woman's ability to become pregnant. It can be defined as the absence of a menstrual period for 12 months without another medical cause. The transition to menopause (perimenopause) most often happens between the ages of 45 and 55, and can last for many years. During perimenopause, hormone levels change in your body, which can cause symptoms and affect your health. Menopause may increase your risk for: Weakened bones (osteoporosis), which causes fractures. Depression. Hardening and narrowing of the arteries (atherosclerosis), which can cause heart attacks and strokes. What are the causes? This condition is usually caused by a natural change in hormone levels that happens as you get older. The condition may also be caused by changes that are not natural, including: Surgery to remove both ovaries (surgical menopause). Side effects from some medicines, such as chemotherapy used to treat cancer (chemical menopause). What increases the risk? This condition is more likely to start at an earlier age if you have certain medical conditions or have undergone treatments, including: A tumor of the pituitary gland in the brain. A disease that affects the ovaries and hormones. Certain cancer treatments, such as chemotherapy or hormone therapy, or radiation therapy on the pelvis. Heavy smoking and excessive alcohol use. Family history of early menopause. This condition is also more likely to develop earlier in women who are verythin. What are the signs or symptoms? Symptoms of this condition include: Hot flashes. Irregular menstrual periods. Night sweats. Changes in feelings about sex. This could be a decrease in sex drive or an increased discomfort around your sexuality. Vaginal dryness and  thinning of the vaginal walls. This may cause painful sex. Dryness of the skin and development of wrinkles. Headaches. Problems sleeping (insomnia). Mood swings or irritability. Memory problems. Weight gain. Hair growth on the face and chest. Bladder infections or problems with urinating. How is this diagnosed? This condition is diagnosed based on your medical history, a physical exam, your age, your menstrual history, and your symptoms. Hormone tests may also bedone. How is this treated? In some cases, no treatment is needed. You and your health care provider should make a decision together about whether treatment is necessary. Treatment will be based on your individual condition and preferences. Treatment for this condition focuses on managing symptoms. Treatment may include: Menopausal hormone therapy (MHT). Medicines to treat specific symptoms or complications. Acupuncture. Vitamin or herbal supplements. Before starting treatment, make sure to let your health care provider know if you have a personal or family history of these conditions: Heart disease. Breast cancer. Blood clots. Diabetes. Osteoporosis. Follow these instructions at home: Lifestyle Do not use any products that contain nicotine or tobacco, such as cigarettes, e-cigarettes, and chewing tobacco. If you need help quitting, ask your health care provider. Get at least 30 minutes of physical activity on 5 or more days each week. Avoid alcoholic and caffeinated beverages, as well as spicy foods. This may help prevent hot flashes. Get 7-8 hours of sleep each night. If you have hot flashes, try: Dressing in layers. Avoiding things that may trigger hot flashes, such as spicy food, warm places, or stress. Taking slow, deep breaths when a hot flash starts. Keeping a fan in your home and office. Find ways to manage stress, such as deep breathing, meditation, or journaling. Consider going to group therapy with other women who  are having menopause symptoms. Ask your   health care provider about recommended group therapy meetings. Eating and drinking  Eat a healthy, balanced diet that contains whole grains, lean protein, low-fat dairy, and plenty of fruits and vegetables. Your health care provider may recommend adding more soy to your diet. Foods that contain soy include tofu, tempeh, and soy milk. Eat plenty of foods that contain calcium and vitamin D for bone health. Items that are rich in calcium include low-fat milk, yogurt, beans, almonds, sardines, broccoli, and kale.  Medicines Take over-the-counter and prescription medicines only as told by your health care provider. Talk with your health care provider before starting any herbal supplements. If prescribed, take vitamins and supplements as told by your health care provider. General instructions  Keep track of your menstrual periods, including: When they occur. How heavy they are and how long they last. How much time passes between periods. Keep track of your symptoms, noting when they start, how often you have them, and how long they last. Use vaginal lubricants or moisturizers to help with vaginal dryness and improve comfort during sex. Keep all follow-up visits. This is important. This includes any group therapy or counseling.  Contact a health care provider if: You are still having menstrual periods after age 55. You have pain during sex. You have not had a period for 12 months and you develop vaginal bleeding. Get help right away if you have: Severe depression. Excessive vaginal bleeding. Pain when you urinate. A fast or irregular heartbeat (palpitations). Severe headaches. Abdominal pain or severe indigestion. Summary Menopause is a normal time of life when menstrual periods stop completely. It is usually defined as the absence of a menstrual period for 12 months without another medical cause. The transition to menopause (perimenopause) most often  happens between the ages of 45 and 55 and can last for several years. Symptoms can be managed through medicines, lifestyle changes, and complementary therapies such as acupuncture. Eat a balanced diet that is rich in nutrients to promote bone health and heart health and to manage symptoms during menopause. This information is not intended to replace advice given to you by your health care provider. Make sure you discuss any questions you have with your healthcare provider. Document Revised: 12/03/2019 Document Reviewed: 08/19/2019 Elsevier Patient Education  2022 [employer] Inc.  

## 2020-08-29 DIAGNOSIS — R140 Abdominal distension (gaseous): Secondary | ICD-10-CM | POA: Insufficient documentation

## 2020-08-29 DIAGNOSIS — R232 Flushing: Secondary | ICD-10-CM | POA: Insufficient documentation

## 2020-08-29 DIAGNOSIS — R002 Palpitations: Secondary | ICD-10-CM | POA: Insufficient documentation

## 2020-08-29 LAB — LIPID PANEL
Cholesterol: 199 mg/dL (ref 0–200)
HDL: 64.6 mg/dL (ref >39.00)
LDL Cholesterol: 110 mg/dL — ABNORMAL HIGH (ref 0–99)
NonHDL: 134.55
Total CHOL/HDL Ratio: 3
Triglycerides: 121 mg/dL (ref 0.0–149.0)
VLDL: 24.2 mg/dL (ref 0.0–40.0)

## 2020-08-29 LAB — COMPREHENSIVE METABOLIC PANEL
ALT: 13 U/L (ref 0–35)
AST: 16 U/L (ref 0–37)
Albumin: 4.6 g/dL (ref 3.5–5.2)
Alkaline Phosphatase: 76 U/L (ref 39–117)
BUN: 16 mg/dL (ref 6–23)
CO2: 29 mEq/L (ref 19–32)
Calcium: 9.5 mg/dL (ref 8.4–10.5)
Chloride: 102 mEq/L (ref 96–112)
Creatinine, Ser: 1.01 mg/dL (ref 0.40–1.20)
GFR: 62.02 mL/min (ref >60.00)
Glucose, Bld: 87 mg/dL (ref 70–99)
Potassium: 4.4 mEq/L (ref 3.5–5.1)
Sodium: 139 mEq/L (ref 135–145)
Total Bilirubin: 0.5 mg/dL (ref 0.2–1.2)
Total Protein: 7 g/dL (ref 6.0–8.3)

## 2020-08-29 LAB — LUTEINIZING HORMONE: LH: 38.28 m[IU]/mL

## 2020-08-29 LAB — CBC WITH DIFFERENTIAL/PLATELET
Basophils Absolute: 0 10*3/uL (ref 0.0–0.1)
Basophils Relative: 0.6 % (ref 0.0–3.0)
Eosinophils Absolute: 0.1 10*3/uL (ref 0.0–0.7)
Eosinophils Relative: 1.4 % (ref 0.0–5.0)
HCT: 38.8 % (ref 36.0–46.0)
Hemoglobin: 13.2 g/dL (ref 12.0–15.0)
Lymphocytes Relative: 40.5 % (ref 12.0–46.0)
Lymphs Abs: 2.7 10*3/uL (ref 0.7–4.0)
MCHC: 34 g/dL (ref 30.0–36.0)
MCV: 94.2 fl (ref 78.0–100.0)
Monocytes Absolute: 0.5 10*3/uL (ref 0.1–1.0)
Monocytes Relative: 7.3 % (ref 3.0–12.0)
Neutro Abs: 3.4 10*3/uL (ref 1.4–7.7)
Neutrophils Relative %: 50.2 % (ref 43.0–77.0)
Platelets: 319 10*3/uL (ref 150.0–400.0)
RBC: 4.11 Mil/uL (ref 3.87–5.11)
RDW: 12.5 % (ref 11.5–15.5)
WBC: 6.8 10*3/uL (ref 4.0–10.5)

## 2020-08-29 LAB — ESTRADIOL: Estradiol: 18 pg/mL

## 2020-08-29 LAB — TSH: TSH: 2.1 u[IU]/mL (ref 0.35–4.50)

## 2020-08-29 LAB — CA 125: CA 125: <3 U/mL (ref <35)

## 2020-08-29 LAB — FOLLICLE STIMULATING HORMONE: FSH: 74.5 m[IU]/mL

## 2020-08-29 NOTE — Assessment & Plan Note (Signed)
Pt does not want HRT She will try black cohosh, estrovan and Amberen Check labs

## 2020-08-29 NOTE — Assessment & Plan Note (Signed)
Check labs  Consider event monitor / echo if symptoms worsen or become more frequent

## 2020-08-29 NOTE — Assessment & Plan Note (Signed)
Check US 

## 2020-09-05 ENCOUNTER — Ambulatory Visit (HOSPITAL_BASED_OUTPATIENT_CLINIC_OR_DEPARTMENT_OTHER)
Admission: RE | Admit: 2020-09-05 | Discharge: 2020-09-05 | Disposition: A | Discharge status: Home / Self Care | Payer: 59 | Source: Ambulatory Visit | Attending: Family Medicine | Admitting: Family Medicine

## 2020-09-05 ENCOUNTER — Encounter: Payer: Self-pay | Admitting: Family Medicine

## 2020-09-05 ENCOUNTER — Other Ambulatory Visit: Payer: Self-pay

## 2020-09-05 DIAGNOSIS — R140 Abdominal distension (gaseous): Secondary | ICD-10-CM | POA: Insufficient documentation

## 2020-09-05 DIAGNOSIS — R232 Flushing: Secondary | ICD-10-CM | POA: Diagnosis present

## 2020-09-06 ENCOUNTER — Encounter: Payer: Self-pay | Admitting: Family Medicine

## 2020-09-06 DIAGNOSIS — R232 Flushing: Secondary | ICD-10-CM

## 2020-09-06 NOTE — Telephone Encounter (Signed)
Referral placed.

## 2020-09-12 ENCOUNTER — Encounter: Payer: Self-pay | Admitting: Family Medicine

## 2020-09-18 IMAGING — MR MR SHOULDER*L* W/O CM
5 series · 40 of 40 positions shown · non-contrast
Comparison: Plain films left shoulder 04/28/2019.

CLINICAL DATA: Left shoulder pain and decreased range of motion for
6 months.

EXAM:
MRI OF THE LEFT SHOULDER WITHOUT CONTRAST
TECHNIQUE: Multiplanar, multisequence MR imaging of the shoulder was performed.
No intravenous contrast was administered.

[Series 3: PD fat-sat · axial · 4.0mm · 0.55mm/px · z∈[-28,+54]mm · 9 of 18 slices shown (1 of 2)]
[im 1/18]
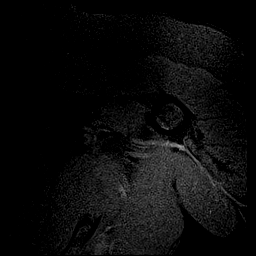
[im 3/18]
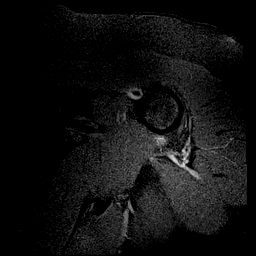
[im 5/18]
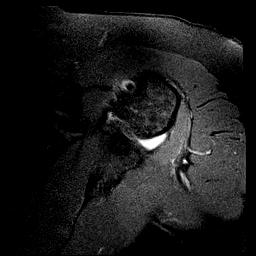
[im 7/18]
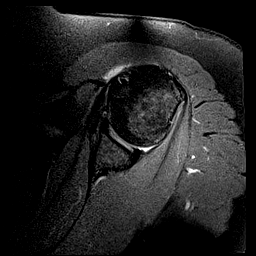
[im 9/18]
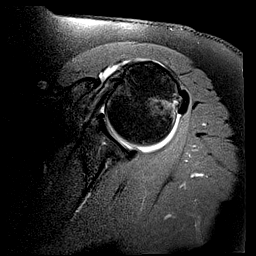
[im 11/18]
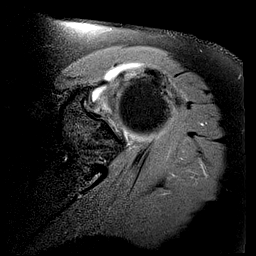
[im 13/18]
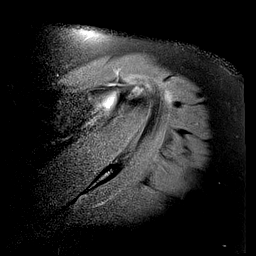
[im 15/18]
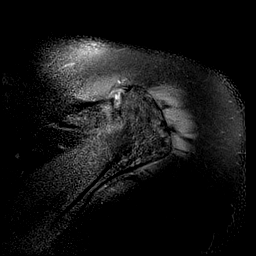
[im 18/18]
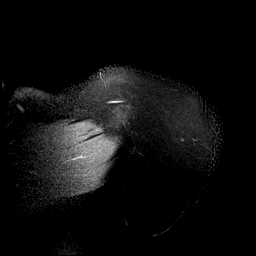

[Series 4: T2 fat-sat · coronal · 4.0mm · 0.55mm/px · 9 of 18 slices shown (1 of 2)]
[im 1/18]
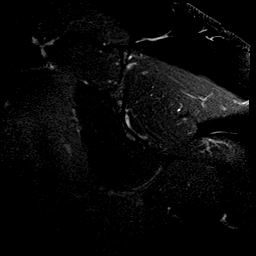
[im 3/18]
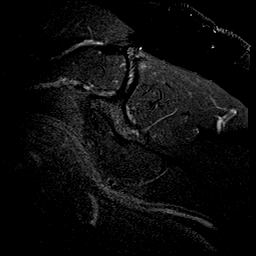
[im 5/18]
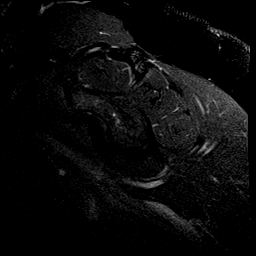
[im 7/18]
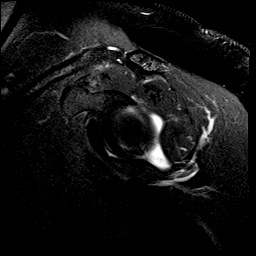
[im 9/18]
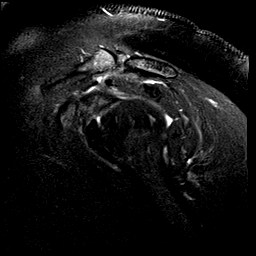
[im 11/18]
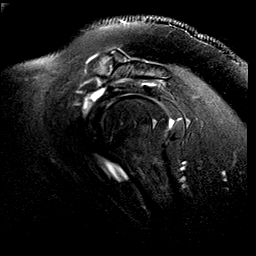
[im 13/18]
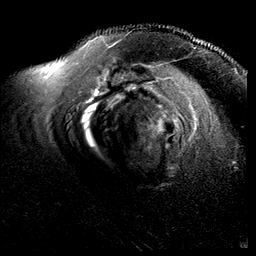
[im 15/18]
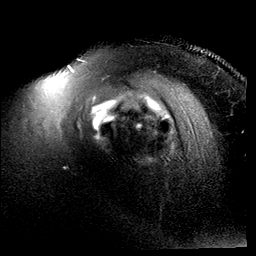
[im 18/18]
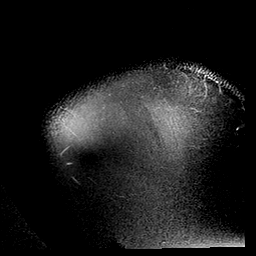

[Series 5: T2 fat-sat · oblique · 4.0mm · 0.55mm/px · 7 of 16 slices shown (2 of 2)]
[im 1/16]
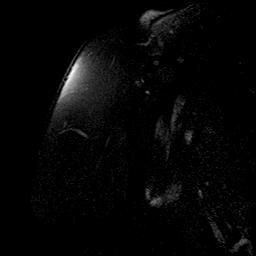
[im 3/16]
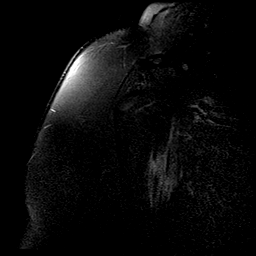
[im 6/16]
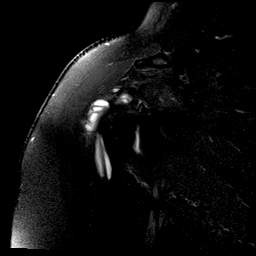
[im 8/16]
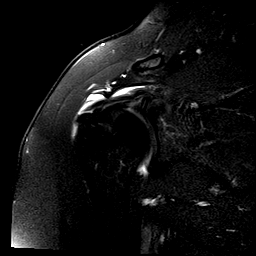
[im 11/16]
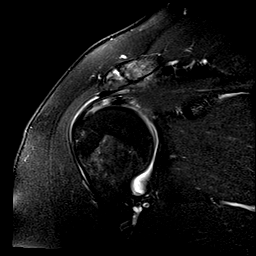
[im 13/16]
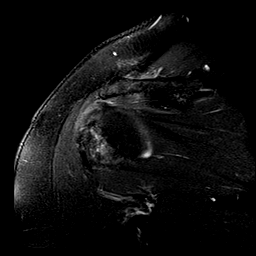
[im 16/16]
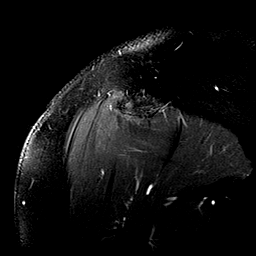

[Series 6: PD fat-sat · oblique · 4.0mm · 0.55mm/px · 7 of 16 slices shown (2 of 2)]
[im 1/16]
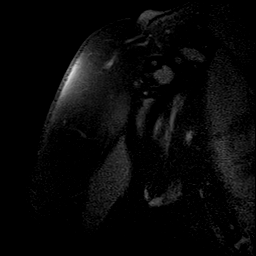
[im 3/16]
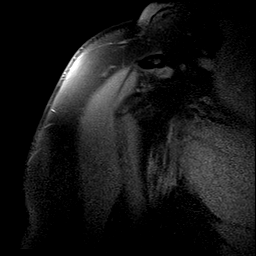
[im 6/16]
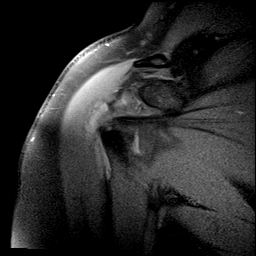
[im 8/16]
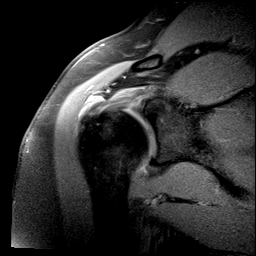
[im 11/16]
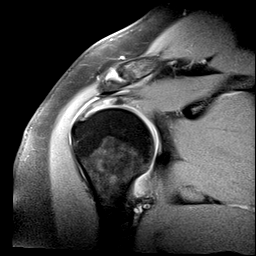
[im 13/16]
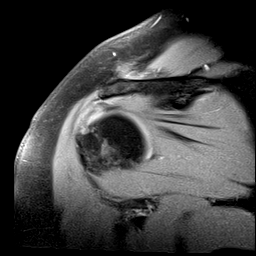
[im 16/16]
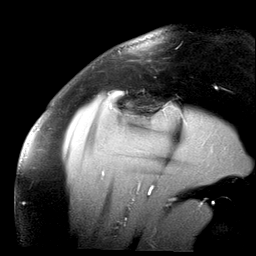

[Series 7: T1 · coronal · 4.0mm · 0.55mm/px · 8 of 18 slices shown]
[im 1/18]
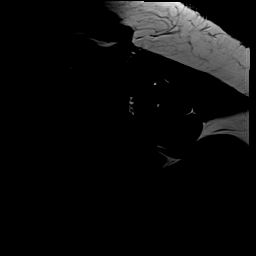
[im 3/18]
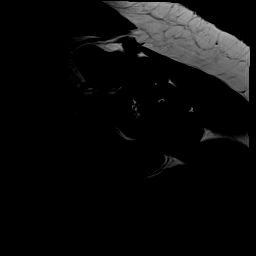
[im 5/18]
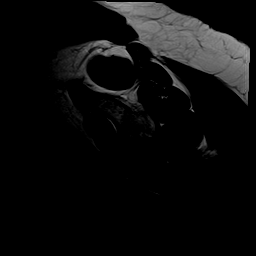
[im 8/18]
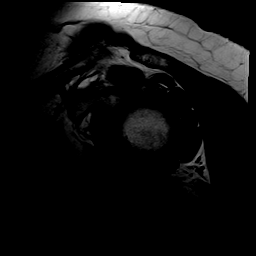
[im 10/18]
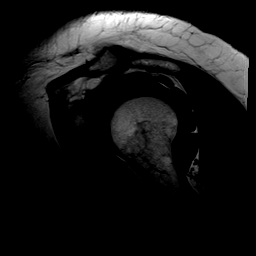
[im 13/18]
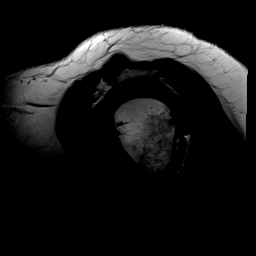
[im 15/18]
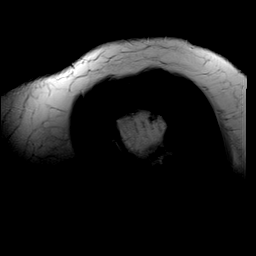
[im 18/18]
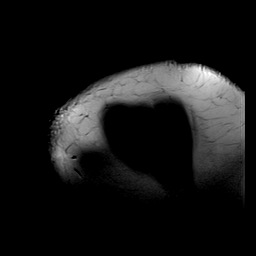

[40 of 40 positions shown; findings below may reference images not displayed]

FINDINGS: Rotator cuff: The patient has rotator cuff tendinopathy. An
undersurface tear of the anterior and far lateral supraspinatus
measures 0.4 cm from front to back. A focal full-thickness
perforation is identified. The majority of the tear demonstrates a
thin rim of intact overlying bursal sided tendon. No retraction. A
calcification measuring 1.3 cm AP x 0.4 cm transverse x 1 cm
craniocaudal is seen in the posterior aspect of the cuff in the
infraspinatus.

Muscles:  Normal without atrophy or focal lesion.

Biceps long head:  Intact.

Acromioclavicular Joint: Moderate osteoarthritis. Type 1 acromion. A
moderate to moderately large volume of fluid is present in the
subacromial/subdeltoid bursa.

Glenohumeral Joint: Negative.

Labrum: The superior labrum is degenerated but no tear is
identified.

Bones:  No fracture or focal lesion.

Other: None.
IMPRESSION: Rotator cuff tendinopathy is worst in the supraspinatus where there
is a 0.4 cm from front to back deep undersurface tear. Focal
full-thickness perforation in the central aspect of this tear is
identified. No retraction or atrophy. Calcification in the
infraspinatus tendon compatible with hydroxyapatite deposition
disease is noted.

Subacromial/subdeltoid bursitis.

Moderate acromioclavicular osteoarthritis.

## 2020-09-20 ENCOUNTER — Encounter: Payer: Self-pay | Admitting: Obstetrics and Gynecology

## 2020-09-20 ENCOUNTER — Ambulatory Visit (INDEPENDENT_AMBULATORY_CARE_PROVIDER_SITE_OTHER): Payer: 59 | Admitting: Obstetrics and Gynecology

## 2020-09-20 ENCOUNTER — Other Ambulatory Visit: Payer: Self-pay

## 2020-09-20 VITALS — BP 100/78 | HR 68 | Ht 61.25 in | Wt 196.0 lb

## 2020-09-20 DIAGNOSIS — N951 Menopausal and female climacteric states: Secondary | ICD-10-CM

## 2020-09-20 NOTE — Progress Notes (Signed)
GYNECOLOGY  VISIT   HPI: 55 y.o.   Married  Caucasian  female   G2P2 with No LMP recorded. Patient has had a hysterectomy.   here for severe hot flashes which also bring on heart palpitations and sweats.Referred by Dr. Zola Button. Patient  states she is completely miserable and nothing is happening.  Had a period of night sweats off and on over the last 10 - 15 years in [place].  She was diagnosed with perimenopause.  States her estrogen levels were high at that time.   Feels hot all day and all night, worse in the am.  This is occurring for about 6 months.  Had an episode of profuse sweating.  A cool rag eased this.  The hot flashes are causing nausea for her.  She even feels pounding of her heart. Does not have them outside but has them when she is indoors.  FSH 74.5, estradiol 18, TSH 2.10 on 08/28/20.   She had recent bloating and CA125 < 3 and pelvic US on 09/05/20 showed a 1.0 cm tiny cyst of left ovary.  Right ovary not visualized.  No free fluid.  She states had hysterectomy age 1 for heavy menses.   FH CHF.   GYNECOLOGIC HISTORY: No LMP recorded. Patient has had a hysterectomy. Contraception: Hyst Menopausal hormone therapy:  none Last mammogram: 09-05-20 SEE [facility]--Called for the follow up imaging.  She states her final dx is calcification.  She will be due again in 6 months.  She had a biopsy in 2020, showing benign calcification.  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     OVER THE COUNTER MEDICATION Probiotic--Provitalize Takes  2 tablets daily     Specialty Vitamins Products (MENOPAUSE SUPPORT PO) Take 1 tablet by mouth daily.     No current facility-administered medications for this visit.     ALLERGIES: Patient has no known allergies.  Family History  Problem Relation Age of Onset   Ataxia Mother    Other Mother        Hereditary Vitamin E Deficiency   Cancer Father        Neuroendocrine CA   Mental  retardation Maternal Grandfather    Heart disease Paternal Grandmother    Heart failure Paternal Grandmother    Coronary artery disease Paternal Grandfather    Suicidality Paternal Grandfather    Diabetes Other    Hypertension Other    Breast cancer Other     Social History   Socioeconomic History   Marital status: Married    Spouse name: Not on file   Number of children: Not on file   Years of education: Not on file   Highest education level: Not on file  Occupational History   Not on file  Tobacco Use   Smoking status: Never   Smokeless tobacco: Never  Substance and Sexual Activity   Alcohol use: Yes    Alcohol/week: 1.0 - 2.0 standard drink    Types: 1 - 2 Glasses of wine per week   Drug use: No   Sexual activity: Yes    Partners: Male  Other Topics Concern   Not on file  Social History Narrative   Not on file   Social Determinants of Health   Financial Resource Strain: Not on file  Food Insecurity: Not on file  Transportation Needs: Not on file  Physical Activity: Not on  file  Stress: Not on file  Social Connections: Not on file  Intimate Partner Violence: Not on file    Review of Systems  All other systems reviewed and are negative.  PHYSICAL EXAMINATION:    BP 100/78 (Cuff Size: Large)   Pulse 68   Ht 5' 1.25" (1.556 m)   Wt 196 lb (88.9 kg)   SpO2 99%   BMI 36.73 kg/m     General appearance: alert, cooperative and appears stated age Head: Normocephalic, without obvious abnormality, atraumatic Lungs: clear to auscultation bilaterally Heart: regular rate and rhythm Extremities: extremities normal, atraumatic, no cyanosis or edema Neurologic: Grossly normal  Pelvic: Deferred.  ASSESSMENT  Menopausal symptoms.   Unremarkable pelvic US. Breast calcifications.   PLAN  We discussed stages of menopause, symptoms and expected changes.  Options for care - observation, herbal, estrogens, SSRI/SNRI, and Neurontin.   Risks and benefits reviewed.  Brochure on menopause.  Fu prn.   35 min total time was spent for this patient encounter, including preparation, face-to-face counseling with the patient, coordination of care, and documentation of the encounter.

## 2020-11-15 ENCOUNTER — Other Ambulatory Visit: Payer: Self-pay

## 2020-11-16 ENCOUNTER — Encounter: Payer: Self-pay | Admitting: Family Medicine

## 2020-11-16 ENCOUNTER — Ambulatory Visit (INDEPENDENT_AMBULATORY_CARE_PROVIDER_SITE_OTHER): Payer: 59 | Admitting: Family Medicine

## 2020-11-16 VITALS — BP 98/80 | HR 63 | Temp 98.6°F | Resp 18 | Ht 61.25 in | Wt 196.8 lb

## 2020-11-16 DIAGNOSIS — Z0000 Encounter for general adult medical examination without abnormal findings: Secondary | ICD-10-CM

## 2020-11-16 DIAGNOSIS — R232 Flushing: Secondary | ICD-10-CM | POA: Diagnosis not present

## 2020-11-16 DIAGNOSIS — R5383 Other fatigue: Secondary | ICD-10-CM

## 2020-11-16 LAB — CORTISOL: Cortisol, Plasma: 7.5 ug/dL

## 2020-11-16 LAB — B12 AND FOLATE PANEL
Folate: >24.4 ng/mL (ref >5.9)
Vitamin B-12: 243 pg/mL (ref 211–911)

## 2020-11-16 LAB — LIPID PANEL
Cholesterol: 186 mg/dL (ref 0–200)
HDL: 59.6 mg/dL (ref >39.00)
LDL Cholesterol: 109 mg/dL — ABNORMAL HIGH (ref 0–99)
NonHDL: 125.97
Total CHOL/HDL Ratio: 3
Triglycerides: 85 mg/dL (ref 0.0–149.0)
VLDL: 17 mg/dL (ref 0.0–40.0)

## 2020-11-16 NOTE — Patient Instructions (Signed)
Preventive Care 40-57 Years Old, Female Preventive care refers to lifestyle choices and visits with your health care provider that can promote health and wellness. This includes: A yearly physical exam. This is also called an annual wellness visit. Regular dental and eye exams. Immunizations. Screening for certain conditions. Healthy lifestyle choices, such as: Eating a healthy diet. Getting regular exercise. Not using drugs or products that contain nicotine and tobacco. Limiting alcohol use. What can I expect for my preventive care visit? Physical exam Your health care provider will check your: Height and weight. These may be used to calculate your BMI (body mass index). BMI is a measurement that tells if you are at a healthy weight. Heart rate and blood pressure. Body temperature. Skin for abnormal spots. Counseling Your health care provider may ask you questions about your: Past medical problems. Family's medical history. Alcohol, tobacco, and drug use. Emotional well-being. Home life and relationship well-being. Sexual activity. Diet, exercise, and sleep habits. Work and work environment. Access to firearms. Method of birth control. Menstrual cycle. Pregnancy history. What immunizations do I need? Vaccines are usually given at various ages, according to a schedule. Your health care provider will recommend vaccines for you based on your age, medical history, and lifestyle or other factors, such as travel or where you work. What tests do I need? Blood tests Lipid and cholesterol levels. These may be checked every 5 years, or more often if you are over 50 years old. Hepatitis C test. Hepatitis B test. Screening Lung cancer screening. You may have this screening every year starting at age 55 if you have a 30-pack-year history of smoking and currently smoke or have quit within the past 15 years. Colorectal cancer screening. All adults should have this screening starting at  age 50 and continuing until age 75. Your health care provider may recommend screening at age 45 if you are at increased risk. You will have tests every 1-10 years, depending on your results and the type of screening test. Diabetes screening. This is done by checking your blood sugar (glucose) after you have not eaten for a while (fasting). You may have this done every 1-3 years. Mammogram. This may be done every 1-2 years. Talk with your health care provider about when you should start having regular mammograms. This may depend on whether you have a family history of breast cancer. BRCA-related cancer screening. This may be done if you have a family history of breast, ovarian, tubal, or peritoneal cancers. Pelvic exam and Pap test. This may be done every 3 years starting at age 21. Starting at age 30, this may be done every 5 years if you have a Pap test in combination with an HPV test. Other tests STD (sexually transmitted disease) testing, if you are at risk. Bone density scan. This is done to screen for osteoporosis. You may have this scan if you are at high risk for osteoporosis. Talk with your health care provider about your test results, treatment options, and if necessary, the need for more tests. Follow these instructions at home: Eating and drinking  Eat a diet that includes fresh fruits and vegetables, whole grains, lean protein, and low-fat dairy products. Take vitamin and mineral supplements as recommended by your health care provider. Do not drink alcohol if: Your health care provider tells you not to drink. You are pregnant, may be pregnant, or are planning to become pregnant. If you drink alcohol: Limit how much you have to 0-1 drink a day. Be   aware of how much alcohol is in your drink. In the [place] one drink equals one 12 oz bottle of beer (355 mL), one 5 oz glass of wine (148 mL), or one 1 oz glass of hard liquor (44 mL). Lifestyle Take daily care of your teeth and  gums. Brush your teeth every morning and night with fluoride toothpaste. Floss one time each day. Stay active. Exercise for at least 30 minutes 5 or more days each week. Do not use any products that contain nicotine or tobacco, such as cigarettes, e-cigarettes, and chewing tobacco. If you need help quitting, ask your health care provider. Do not use drugs. If you are sexually active, practice safe sex. Use a condom or other form of protection to prevent STIs (sexually transmitted infections). If you do not wish to become pregnant, use a form of birth control. If you plan to become pregnant, see your health care provider for a prepregnancy visit. If told by your health care provider, take low-dose aspirin daily starting at age 63. Find healthy ways to cope with stress, such as: Meditation, yoga, or listening to music. Journaling. Talking to a trusted person. Spending time with friends and family. Safety Always wear your seat belt while driving or riding in a vehicle. Do not drive: If you have been drinking alcohol. Do not ride with someone who has been drinking. When you are tired or distracted. While texting. Wear a helmet and other protective equipment during sports activities. If you have firearms in your house, make sure you follow all gun safety procedures. What's next? Visit your health care provider once a year for an annual wellness visit. Ask your health care provider how often you should have your eyes and teeth checked. Stay up to date on all vaccines. This information is not intended to replace advice given to you by your health care provider. Make sure you discuss any questions you have with your health care provider. Document Revised: 05/12/2020 Document Reviewed: 11/13/2017 Elsevier Patient Education  2022 [employer].

## 2020-11-16 NOTE — Progress Notes (Addendum)
Subjective:   By signing my name below, I, Shehryar Baig, attest that this documentation has been prepared under the direction and in the presence of Dr. Seabron SpatesLowne-Chase, Steele Stracener, DO. 11/16/2020    Patient ID: Julie Gibbs, female    DOB: 05-30-1965, 55 y.o.   MRN: [id number]  Chief Complaint  Patient presents with   Annual Exam    Pt states fasting     HPI Patient is in today for a comprehensive physical exam.  She continues having hot flashes at this time. She is taking provitalize supplements to manage her symptoms and reports having some improvement. She prefers not to take anti-depressants or estrogen to manage her symptoms.  She is requesting to get her thyroid levels checked during her next lab work.  She denies having any fever, ear pain, congestion, sinus pain, sore throat, eye pain, chest pain, palpations, new skin moles, cough, SOB, wheezing, n/v/d, constipation, blood in stool, dysuria, frequency, hematuria, or headaches at this time.  She has no surgeries in the past year.  She is participating in exercise by playing golf, doing yard work, and occasionally yoga.  She is not interested in getting the flu vaccine. She is not interested in getting the tetanus vaccine. She does not have the Covid-19 vaccine and is not interested in getting it.    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    Past Surgical History:  Procedure Laterality Date   ABDOMINAL HYSTERECTOMY  07/16/1992   ABDOMINOPLASTY  "About 5 years ago"   [facility]   TONSILLECTOMY  Kindergarten    Family History  Problem Relation Age of Onset   Ataxia Mother    Other Mother        Hereditary Vitamin E Deficiency   Cancer Father        Neuroendocrine CA   Mental retardation Maternal Grandfather    Heart disease Paternal Grandmother    Heart  failure Paternal Grandmother    Coronary artery disease Paternal Grandfather    Suicidality Paternal Grandfather    Diabetes Other    Hypertension Other    Breast cancer Other     Social History   Socioeconomic History   Marital status: Married    Spouse name: Not on file   Number of children: Not on file   Years of education: Not on file   Highest education level: Not on file  Occupational History   Not on file  Tobacco Use   Smoking status: Never   Smokeless tobacco: Never  Substance and Sexual Activity   Alcohol use: Yes    Alcohol/week: 1.0 - 2.0 standard drink    Types: 1 - 2 Glasses of wine per week   Drug use: No   Sexual activity: Yes    Partners: Male  Other Topics Concern   Not on file  Social History Narrative   Exercise--golfing/ yard work   Occasionally yoga   Social Determinants of Health   Financial Resource Strain: Not on file  Food Insecurity: Not on file  Transportation Needs: Not on file  Physical Activity: Not on file  Stress: Not on file  Social Connections: Not on file  Intimate Partner Violence: Not on file    Outpatient Medications Prior to Visit  Medication Sig Dispense Refill   Multiple  Vitamins-Minerals (QC WOMENS DAILY MULTIVITAMIN PO) Take 1 tablet by mouth daily.     OVER THE COUNTER MEDICATION Probiotic--Provitalize Takes  2 tablets daily     Specialty Vitamins Products (MENOPAUSE SUPPORT PO) Take 1 tablet by mouth daily.     No facility-administered medications prior to visit.    No Known Allergies  Review of Systems  Constitutional:  Negative for fever and malaise/fatigue.  HENT:  Negative for congestion, ear pain, sinus pain and sore throat.   Eyes:  Negative for blurred vision and pain.  Respiratory:  Negative for cough, shortness of breath and wheezing.   Cardiovascular:  Negative for chest pain, palpitations and leg swelling.  Gastrointestinal:  Negative for abdominal pain, blood in stool, constipation, diarrhea,  nausea and vomiting.  Genitourinary:  Negative for dysuria, frequency and hematuria.  Musculoskeletal:  Negative for back pain and falls.  Skin:  Negative for rash.       (-)New skin moles  Neurological:  Negative for dizziness, loss of consciousness and headaches.  Endo/Heme/Allergies:  Negative for environmental allergies.  Psychiatric/Behavioral:  Negative for depression. The patient is not nervous/anxious.       Objective:    Physical Exam Vitals and nursing note reviewed.  Constitutional:      General: She is not in acute distress.    Appearance: Normal appearance. She is well-developed. She is not ill-appearing or diaphoretic.  HENT:     Head: Normocephalic and atraumatic.     Right Ear: Tympanic membrane, ear canal and external ear normal.     Left Ear: Tympanic membrane, ear canal and external ear normal.     Nose: Nose normal.  Eyes:     General:        Right eye: No discharge.        Left eye: No discharge.     Extraocular Movements: Extraocular movements intact.     Conjunctiva/sclera: Conjunctivae normal.     Pupils: Pupils are equal, round, and reactive to light.  Neck:     Thyroid: No thyromegaly.     Vascular: No JVD.  Cardiovascular:     Rate and Rhythm: Normal rate and regular rhythm.     Heart sounds: Normal heart sounds. No murmur heard.   No gallop.  Pulmonary:     Effort: Pulmonary effort is normal. No respiratory distress.     Breath sounds: Normal breath sounds. No wheezing or rales.  Chest:     Chest wall: No tenderness.  Abdominal:     General: Bowel sounds are normal. There is no distension.     Palpations: Abdomen is soft. There is no mass.     Tenderness: There is no abdominal tenderness. There is no guarding or rebound.  Musculoskeletal:        General: No tenderness. Normal range of motion.     Cervical back: Normal range of motion and neck supple.  Lymphadenopathy:     Cervical: No cervical adenopathy.  Skin:    General: Skin is warm  and dry.     Findings: No erythema or rash.  Neurological:     Mental Status: She is alert and oriented to person, place, and time.     Cranial Nerves: No cranial nerve deficit.     Deep Tendon Reflexes: Reflexes are normal and symmetric.  Psychiatric:        Behavior: Behavior normal.        Thought Content: Thought content normal.  Judgment: Judgment normal.    BP 98/80 (BP Location: Right Arm, Patient Position: Sitting, Cuff Size: Normal)   Pulse 63   Temp 98.6 F (37 C) (Oral)   Resp 18   Ht 5' 1.25" (1.556 m)   Wt 196 lb 12.8 oz (89.3 kg)   SpO2 98%   BMI 36.88 kg/m  Wt Readings from Last 3 Encounters:  11/16/20 196 lb 12.8 oz (89.3 kg)  09/20/20 196 lb (88.9 kg)  08/28/20 197 lb (89.4 kg)    Diabetic Foot Exam - Simple   No data filed    Lab Results  Component Value Date   WBC 6.8 08/28/2020   HGB 13.2 08/28/2020   HCT 38.8 08/28/2020   PLT 319.0 08/28/2020   GLUCOSE 87 08/28/2020   CHOL 199 08/28/2020   TRIG 121.0 08/28/2020   HDL 64.60 08/28/2020   LDLCALC 110 (H) 08/28/2020   ALT 13 08/28/2020   AST 16 08/28/2020   NA 139 08/28/2020   K 4.4 08/28/2020   CL 102 08/28/2020   CREATININE 1.01 08/28/2020   BUN 16 08/28/2020   CO2 29 08/28/2020   TSH 2.10 08/28/2020    Lab Results  Component Value Date   TSH 2.10 08/28/2020   Lab Results  Component Value Date   WBC 6.8 08/28/2020   HGB 13.2 08/28/2020   HCT 38.8 08/28/2020   MCV 94.2 08/28/2020   PLT 319.0 08/28/2020   Lab Results  Component Value Date   NA 139 08/28/2020   K 4.4 08/28/2020   CO2 29 08/28/2020   GLUCOSE 87 08/28/2020   BUN 16 08/28/2020   CREATININE 1.01 08/28/2020   BILITOT 0.5 08/28/2020   ALKPHOS 76 08/28/2020   AST 16 08/28/2020   ALT 13 08/28/2020   PROT 7.0 08/28/2020   ALBUMIN 4.6 08/28/2020   CALCIUM 9.5 08/28/2020   ANIONGAP 8 02/21/2020   GFR 62.02 08/28/2020   Lab Results  Component Value Date   CHOL 199 08/28/2020   Lab Results  Component  Value Date   HDL 64.60 08/28/2020   Lab Results  Component Value Date   LDLCALC 110 (H) 08/28/2020   Lab Results  Component Value Date   TRIG 121.0 08/28/2020   Lab Results  Component Value Date   CHOLHDL 3 08/28/2020   No results found for: HGBA1C  Mammogram- Last completed 09/05/2020. Results showed new calcification of left breast which are indeterminate.  Colonoscopy- Last completed 06/27/2015. Results are normal. Repeat in 10 years.  Pap smear- Last completed 04/01/2016. Results are normal. Repeat in 3 years.      Assessment & Plan:   Problem List Items Addressed This Visit       Unprioritized   Hot flashes    Pt is taking otc med that is taking the edge off her symptoms  She will con't this She prefers no antianxiety meds or hormones       Relevant Orders   Thyroid Panel With TSH   Cortisol   B12 and Folate Panel   VITAMIN D 25 Hydroxy (Vit-D Deficiency, Fractures)   Preventative health care - Primary    ghm utd Check labs       Relevant Orders   Lipid panel   B12 and Folate Panel   VITAMIN D 25 Hydroxy (Vit-D Deficiency, Fractures)   Other Visit Diagnoses     Other fatigue       Relevant Orders   B12 and Folate Panel   VITAMIN D  25 Hydroxy (Vit-D Deficiency, Fractures)        No orders of the defined types were placed in this encounter.   I, Dr. Seabron Spates, DO, personally preformed the services described in this documentation.  All medical record entries made by the scribe were at my direction and in my presence.  I have reviewed the chart and discharge instructions (if applicable) and agree that the record reflects my personal performance and is accurate and complete. 10/16/2020   I,Shehryar Baig,acting as a scribe for Donato Schultz, DO.,have documented all relevant documentation on the behalf of Donato Schultz, DO,as directed by  Donato Schultz, DO while in the presence of Donato Schultz, DO.   Donato Schultz, DO

## 2020-11-16 NOTE — Assessment & Plan Note (Signed)
Pt is taking otc med that is taking the edge off her symptoms  She will con't this She prefers no antianxiety meds or hormones

## 2020-11-16 NOTE — Assessment & Plan Note (Signed)
ghm utd Check labs  

## 2020-11-17 LAB — THYROID PANEL WITH TSH
Free Thyroxine Index: 2.2 (ref 1.4–3.8)
T3 Uptake: 31 % (ref 22–35)
T4, Total: 7 ug/dL (ref 5.1–11.9)
TSH: 1.85 mIU/L (ref 0.40–4.50)

## 2020-12-21 ENCOUNTER — Ambulatory Visit (INDEPENDENT_AMBULATORY_CARE_PROVIDER_SITE_OTHER): Payer: 59 | Admitting: Family Medicine

## 2020-12-21 ENCOUNTER — Encounter: Payer: Self-pay | Admitting: Family Medicine

## 2020-12-21 ENCOUNTER — Other Ambulatory Visit: Payer: Self-pay

## 2020-12-21 VITALS — BP 124/60 | HR 64 | Temp 97.7°F | Resp 18 | Ht 61.25 in | Wt 195.0 lb

## 2020-12-21 DIAGNOSIS — J029 Acute pharyngitis, unspecified: Secondary | ICD-10-CM | POA: Diagnosis not present

## 2020-12-21 LAB — POCT RAPID STREP A (OFFICE): Rapid Strep A Screen: NEGATIVE

## 2020-12-21 NOTE — Progress Notes (Signed)
Subjective:   By signing my name below, I, Shehryar Baig, attest that this documentation has been prepared under the direction and in the presence of Dr. Seabron Spates, DO. 12/21/2020    Patient ID: Julie Gibbs, female    DOB: June 29, 1965, 55 y.o.   MRN: [id number]  Chief Complaint  Patient presents with   Sore Throat    Pt states sxs started on Sunday. Pt seen in [facility] on Tuesday and was given a shot. Pt states pain with swallowing. Pt states nt tested for strep. COVID test was negative.     Sore Throat  Associated symptoms include coughing. Pertinent negatives include no abdominal pain, congestion, headaches or shortness of breath.  Patient is in today for a office visit.  She complains of sore throat since Sunday. She reports today she developed throat tightness and cough today. She is allergic to cats and reports while cleaning her mother house she thinks her allergy was triggered because her mother is rasing a cat. She is taking a single Nyquil capsule every night to manage her symptoms. She reports not doing well while on anti-histamines so she does not take them to manage her symptoms. She has taken an at home Covid-19 test on Sunday and tested negative. She went to the urgent care and was given and decadron injection and reports relief.  She reports that they did not measure her vitamin D levels during her last lab work.    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    Past Surgical History:  Procedure Laterality Date   ABDOMINAL HYSTERECTOMY  07/16/1992   ABDOMINOPLASTY  "About 5 years ago"   [facility]SY [facility]   TONSILLECTOMY  Kindergarten    Family History  Problem Relation Age of Onset   Ataxia Mother    Other Mother        Hereditary Vitamin E Deficiency   Cancer Father        Neuroendocrine CA   Mental  retardation Maternal Grandfather    Heart disease Paternal Grandmother    Heart failure Paternal Grandmother    Coronary artery disease Paternal Grandfather    Suicidality Paternal Grandfather    Diabetes Other    Hypertension Other    Breast cancer Other     Social History   Socioeconomic History   Marital status: Married    Spouse name: Not on file   Number of children: Not on file   Years of education: Not on file   Highest education level: Not on file  Occupational History   Not on file  Tobacco Use   Smoking status: Never   Smokeless tobacco: Never  Substance and Sexual Activity   Alcohol use: Yes    Alcohol/week: 1.0 - 2.0 standard drink    Types: 1 - 2 Glasses of wine per week   Drug use: No   Sexual activity: Yes    Partners: Male  Other Topics Concern   Not on file  Social History Narrative   Exercise--golfing/ yard work   Occasionally yoga   Social Determinants of Health   Financial Resource Strain: Not on file  Food Insecurity: Not on file  Transportation Needs: Not on file  Physical Activity: Not on file  Stress: Not on file  Social Connections: Not on file  Intimate Partner Violence: Not on file    Outpatient Medications Prior to Visit  Medication Sig Dispense Refill   Multiple Vitamins-Minerals (QC WOMENS DAILY MULTIVITAMIN PO) Take 1 tablet by mouth daily.     OVER THE COUNTER MEDICATION Probiotic--Provitalize Takes  2 tablets daily     Specialty Vitamins Products (MENOPAUSE SUPPORT PO) Take 1 tablet by mouth daily.     No facility-administered medications prior to visit.    No Known Allergies  Review of Systems  Constitutional:  Negative for fever and malaise/fatigue.  HENT:  Positive for sore throat. Negative for congestion.        (+)Throat tightness  Eyes:  Negative for blurred vision.  Respiratory:  Positive for cough. Negative for shortness of breath.   Cardiovascular:  Negative for chest pain, palpitations and leg swelling.   Gastrointestinal:  Negative for abdominal pain, blood in stool and nausea.  Genitourinary:  Negative for dysuria and frequency.  Musculoskeletal:  Negative for falls.  Skin:  Negative for rash.  Neurological:  Negative for dizziness, loss of consciousness and headaches.  Endo/Heme/Allergies:  Negative for environmental allergies.  Psychiatric/Behavioral:  Negative for depression. The patient is not nervous/anxious.       Objective:    Physical Exam Vitals and nursing note reviewed.  Constitutional:      General: She is not in acute distress.    Appearance: Normal appearance. She is not ill-appearing.  HENT:     Head: Normocephalic and atraumatic.     Right Ear: External ear normal.     Left Ear: External ear normal.     Nose: No congestion or rhinorrhea.     Mouth/Throat:     Comments: Strept test taken in office results were negative Eyes:     Extraocular Movements: Extraocular movements intact.     Pupils: Pupils are equal, round, and reactive to light.  Cardiovascular:     Rate and Rhythm: Normal rate and regular rhythm.     Heart sounds: Normal heart sounds. No murmur heard.   No gallop.  Pulmonary:     Effort: Pulmonary effort is normal. No respiratory distress.     Breath sounds: Normal breath sounds. No wheezing or rales.  Lymphadenopathy:     Cervical: Cervical adenopathy present.  Skin:    General: Skin is warm and dry.  Neurological:     Mental Status: She is alert and oriented to person, place, and time.  Psychiatric:        Behavior: Behavior normal.        Judgment: Judgment normal.    BP 124/60 (BP Location: Left Arm, Patient Position: Sitting, Cuff Size: Large)   Pulse 64   Temp 97.7 F (36.5 C) (Oral)   Resp 18   Ht 5' 1.25" (1.556 m)   Wt 195 lb (88.5 kg)   SpO2 97%   BMI 36.54 kg/m  Wt Readings from Last 3 Encounters:  12/21/20 195 lb (88.5 kg)  11/16/20 196 lb 12.8 oz (89.3 kg)  09/20/20 196 lb (88.9 kg)    Diabetic Foot Exam - Simple    No data filed    Lab Results  Component Value Date   WBC 6.8 08/28/2020   HGB 13.2 08/28/2020   HCT 38.8 08/28/2020   PLT 319.0 08/28/2020   GLUCOSE 87 08/28/2020   CHOL 186 11/16/2020   TRIG 85.0 11/16/2020   HDL 59.60 11/16/2020   LDLCALC 109 (H) 11/16/2020   ALT 13 08/28/2020   AST 16 08/28/2020  NA 139 08/28/2020   K 4.4 08/28/2020   CL 102 08/28/2020   CREATININE 1.01 08/28/2020   BUN 16 08/28/2020   CO2 29 08/28/2020   TSH 1.85 11/16/2020    Lab Results  Component Value Date   TSH 1.85 11/16/2020   Lab Results  Component Value Date   WBC 6.8 08/28/2020   HGB 13.2 08/28/2020   HCT 38.8 08/28/2020   MCV 94.2 08/28/2020   PLT 319.0 08/28/2020   Lab Results  Component Value Date   NA 139 08/28/2020   K 4.4 08/28/2020   CO2 29 08/28/2020   GLUCOSE 87 08/28/2020   BUN 16 08/28/2020   CREATININE 1.01 08/28/2020   BILITOT 0.5 08/28/2020   ALKPHOS 76 08/28/2020   AST 16 08/28/2020   ALT 13 08/28/2020   PROT 7.0 08/28/2020   ALBUMIN 4.6 08/28/2020   CALCIUM 9.5 08/28/2020   ANIONGAP 8 02/21/2020   GFR 62.02 08/28/2020   Lab Results  Component Value Date   CHOL 186 11/16/2020   Lab Results  Component Value Date   HDL 59.60 11/16/2020   Lab Results  Component Value Date   LDLCALC 109 (H) 11/16/2020   Lab Results  Component Value Date   TRIG 85.0 11/16/2020   Lab Results  Component Value Date   CHOLHDL 3 11/16/2020   No results found for: HGBA1C     Assessment & Plan:   Problem List Items Addressed This Visit       Unprioritized   Sore throat - Primary    Strep neg Symptoms improving She is [facility]       Relevant Orders   POCT rapid strep A (Completed)     No orders of the defined types were placed in this encounter.   I, Dr. Seabron Spates, DO, personally preformed the services described in this documentation.  All medical record entries made by the scribe were at my direction and in my  presence.  I have reviewed the chart and discharge instructions (if applicable) and agree that the record reflects my personal performance and is accurate and complete. 12/21/2020   I,Shehryar Baig,acting as a scribe for Donato Schultz, DO.,have documented all relevant documentation on the behalf of Donato Schultz, DO,as directed by  Donato Schultz, DO while in the presence of Donato Schultz, DO.   Donato Schultz, DO

## 2020-12-21 NOTE — Patient Instructions (Signed)
Pharyngitis °Pharyngitis is inflammation of the throat (pharynx). It is a very common cause of sore throat. Pharyngitis can be caused by a bacteria, but it is usually caused by a virus. Most cases of pharyngitis get better on their own without treatment. °What are the causes? °This condition may be caused by: °Infection by viruses (viral). Viral pharyngitis spreads easily from person to person (is contagious) through coughing, sneezing, and sharing of personal items or utensils such as cups, forks, spoons, and toothbrushes. °Infection by bacteria (bacterial). Bacterial pharyngitis may be spread by touching the nose or face after coming in contact with the bacteria, or through close contact, such as kissing. °Allergies. Allergies can cause buildup of mucus in the throat (post-nasal drip), leading to inflammation and irritation. Allergies can also cause blocked nasal passages, forcing breathing through the mouth, which dries and irritates the throat. °What increases the risk? °You are more likely to develop this condition if: °You are 5-24 years old. °You are exposed to crowded environments such as daycare, school, or dormitory living. °You live in a cold climate. °You have a weakened disease-fighting (immune) system. °What are the signs or symptoms? °Symptoms of this condition vary by the cause. Common symptoms of this condition include: °Sore throat. °Fatigue. °Low-grade fever. °Stuffy nose (nasal congestion) and cough. °Headache. °Other symptoms may include: °Glands in the neck (lymph nodes) that are swollen. °Skin rashes. °Plaque-like film on the throat or tonsils. This is often a symptom of bacterial pharyngitis. °Vomiting. °Red, itchy eyes (conjunctivitis). °Loss of appetite. °Joint pain and muscle aches. °Enlarged tonsils. °How is this diagnosed? °This condition may be diagnosed based on your medical history and a physical exam. Your health care provider will ask you questions about your illness and your  symptoms. °A swab of your throat may be done to check for bacteria (rapid strep test). Other lab tests may also be done, depending on the suspected cause, but these are rare. °How is this treated? °Many times, treatment is not needed for this condition. Pharyngitis usually gets better in 3-4 days without treatment. °Bacterial pharyngitis may be treated with antibiotic medicines. °Follow these instructions at home: °Medicines °Take over-the-counter and prescription medicines only as told by your health care provider. °If you were prescribed an antibiotic medicine, take it as told by your health care provider. Do not stop taking the antibiotic even if you start to feel better. °Use throat sprays to soothe your throat as told by your health care provider. °Children can get pharyngitis. Do not give your child aspirin because of the association with Reye's syndrome. °Managing pain °To help with pain, try: °Sipping warm liquids, such as broth, herbal tea, or warm water. °Eating or drinking cold or frozen liquids, such as frozen ice pops. °Gargling with a mixture of salt and water 3-4 times a day or as needed. To make salt water, completely dissolve ½-1 tsp (3-6 g) of salt in 1 cup (237 mL) of warm water. °Sucking on hard candy or throat lozenges. °Putting a cool-mist humidifier in your bedroom at night to moisten the air. °Sitting in the bathroom with the door closed for 5-10 minutes while you run hot water in the shower. ° °General instructions ° °Do not use any products that contain nicotine or tobacco. These products include cigarettes, chewing tobacco, and vaping devices, such as e-cigarettes. If you need help quitting, ask your health care provider. °Rest as told by your health care provider. °Drink enough fluid to keep your urine pale yellow. °How   is this prevented? °To help prevent becoming infected or spreading infection: °Wash your hands often with soap and water for at least 20 seconds. If soap and water are not  available, use hand sanitizer. °Do not touch your eyes, nose, or mouth with unwashed hands, and wash hands after touching these areas. °Do not share cups or eating utensils. °Avoid close contact with people who are sick. °Contact a health care provider if: °You have large, tender lumps in your neck. °You have a rash. °You cough up green, yellow-brown, or bloody mucus. °Get help right away if: °Your neck becomes stiff. °You drool or are unable to swallow liquids. °You cannot drink or take medicines without vomiting. °You have severe pain that does not go away, even after you take medicine. °You have trouble breathing, and it is not caused by a stuffy nose. °You have new pain and swelling in your joints such as the knees, ankles, wrists, or elbows. °These symptoms may represent a serious problem that is an emergency. Do not wait to see if the symptoms will go away. Get medical help right away. Call your local emergency services (911 in the [place] Do not drive yourself to the hospital. °Summary °Pharyngitis is redness, pain, and swelling (inflammation) of the throat (pharynx). °While pharyngitis can be caused by a bacteria, the most common causes are viral. °Most cases of pharyngitis get better on their own without treatment. °Bacterial pharyngitis is treated with antibiotic medicines. °This information is not intended to replace advice given to you by your health care provider. Make sure you discuss any questions you have with your health care provider. °Document Revised: 05/31/2020 Document Reviewed: 05/31/2020 °Elsevier Patient Education © 2022 [employer] ° °

## 2020-12-21 NOTE — Assessment & Plan Note (Signed)
Strep neg Symptoms improving She is still on [facility] from [facility]

## 2021-03-21 ENCOUNTER — Telehealth: Payer: Self-pay | Admitting: Family Medicine

## 2021-03-21 NOTE — Telephone Encounter (Signed)
Tresa Endo from [facility] called regarding pt. Unable to fax lab results would like for someone to contact her so she could send them via email. Please advise.

## 2021-03-21 NOTE — Telephone Encounter (Signed)
Left message on machine with my email address for her to send labs to.

## 2021-04-17 ENCOUNTER — Encounter: Payer: Self-pay | Admitting: Family Medicine

## 2021-04-17 HISTORY — PX: BREAST BIOPSY: SHX20

## 2021-04-18 ENCOUNTER — Encounter: Payer: Self-pay | Admitting: Family Medicine

## 2021-04-30 LAB — HM MAMMOGRAPHY

## 2021-05-16 ENCOUNTER — Other Ambulatory Visit: Payer: Self-pay | Admitting: Radiology

## 2021-09-14 LAB — HM MAMMOGRAPHY

## 2021-11-20 ENCOUNTER — Ambulatory Visit (INDEPENDENT_AMBULATORY_CARE_PROVIDER_SITE_OTHER): Payer: 59 | Admitting: Family Medicine

## 2021-11-20 ENCOUNTER — Encounter: Payer: Self-pay | Admitting: Family Medicine

## 2021-11-20 VITALS — BP 112/70 | HR 71 | Temp 97.8°F | Resp 18 | Ht 61.25 in | Wt 196.8 lb

## 2021-11-20 DIAGNOSIS — Z0000 Encounter for general adult medical examination without abnormal findings: Secondary | ICD-10-CM | POA: Diagnosis not present

## 2021-11-20 LAB — COMPREHENSIVE METABOLIC PANEL
ALT: 11 U/L (ref 0–35)
AST: 14 U/L (ref 0–37)
Albumin: 4.2 g/dL (ref 3.5–5.2)
Alkaline Phosphatase: 78 U/L (ref 39–117)
BUN: 13 mg/dL (ref 6–23)
CO2: 29 mEq/L (ref 19–32)
Calcium: 9.1 mg/dL (ref 8.4–10.5)
Chloride: 104 mEq/L (ref 96–112)
Creatinine, Ser: 0.91 mg/dL (ref 0.40–1.20)
GFR: 69.69 mL/min (ref >60.00)
Glucose, Bld: 94 mg/dL (ref 70–99)
Potassium: 4.6 mEq/L (ref 3.5–5.1)
Sodium: 140 mEq/L (ref 135–145)
Total Bilirubin: 0.5 mg/dL (ref 0.2–1.2)
Total Protein: 6.6 g/dL (ref 6.0–8.3)

## 2021-11-20 LAB — LIPID PANEL
Cholesterol: 175 mg/dL (ref 0–200)
HDL: 52.1 mg/dL (ref >39.00)
LDL Cholesterol: 110 mg/dL — ABNORMAL HIGH (ref 0–99)
NonHDL: 122.79
Total CHOL/HDL Ratio: 3
Triglycerides: 66 mg/dL (ref 0.0–149.0)
VLDL: 13.2 mg/dL (ref 0.0–40.0)

## 2021-11-20 NOTE — Progress Notes (Signed)
Subjective:   By signing my name below, I, Shehryar Baig, attest that this documentation has been prepared under the direction and in the presence of Dr. Seabron Spates, DO. 11/20/2021    Patient ID: Julie Gibbs, female    DOB: 1966-01-11, 56 y.o.   MRN: [id number]  Chief Complaint  Patient presents with   Annual Exam    Pt states fasting     HPI Patient is in today for a comprehensive physical exam.   She is taking vitamin D and multi-vitamin supplements regularly.  She denies having any fever, new moles, congestion, sore throat, new muscle pain, new joint pain, chest pain, cough, SOB, wheezing, n/v/d, constipation, blood in stool, dysuria, frequency, hematuria, or headaches at this time. She reports having a follow mammogram on September 14, 2020. Following her mammogram she needed a left breast biopsy and completed it on April 17, 2021. Otherwise, she has no surgical procedures to report.  She is due for a tetanus vaccine and is not interested in receiving it. She is UTD on shingles vaccines. She has no Covid-19 vaccines.  She is UTD on vision care. She is UTD on dental care.    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    Past Surgical History:  Procedure Laterality Date   ABDOMINAL HYSTERECTOMY  07/16/1992   ABDOMINOPLASTY  "About 5 years ago"   [facility]   [facility]17/2021   [facility]   TONSILLECTOMY  Kindergarten    Family History  Problem Relation Age of Onset   Ataxia Mother    Other Mother        Hereditary Vitamin E Deficiency   Cancer Father        Neuroendocrine CA   Mental retardation Maternal Grandfather    Heart disease Paternal Grandmother    Heart failure Paternal Grandmother    Coronary artery disease Paternal Grandfather    Suicidality Paternal Grandfather    Diabetes Other     Hypertension Other    Breast cancer Other     Social History   Socioeconomic History   Marital status: Married    Spouse name: Not on file   Number of children: Not on file   Years of education: Not on file   Highest education level: Not on file  Occupational History   Not on file  Tobacco Use   Smoking status: Never   Smokeless tobacco: Never  Substance and Sexual Activity   Alcohol use: Yes    Alcohol/week: 1.0 - 2.0 standard drink of alcohol    Types: 1 - 2 Glasses of wine per week   Drug use: No   Sexual activity: Yes    Partners: Male  Other Topics Concern   Not on file  Social History Narrative   Exercise--golfing/ yard work   Occasionally yoga   Social Determinants of Health   Financial Resource Strain: Not on file  Food Insecurity: Not on file  Transportation Needs: Not on file  Physical Activity: Not on file  Stress: Not on file  Social Connections: Not on file  Intimate Partner Violence: Not on file    Outpatient Medications Prior to Visit  Medication Sig Dispense Refill   NON FORMULARY Metagenics D3 + k     NON FORMULARY Pure O.N.E Multivitamin  NON FORMULARY Pure Digestive Enzymes Ultra     NON FORMULARY Ancient Nutrition: Super Greens     Multiple Vitamins-Minerals (QC WOMENS DAILY MULTIVITAMIN PO) Take 1 tablet by mouth daily.     OVER THE COUNTER MEDICATION Probiotic--Provitalize Takes  2 tablets daily     Specialty Vitamins Products (MENOPAUSE SUPPORT PO) Take 1 tablet by mouth daily.     No facility-administered medications prior to visit.    No Known Allergies  Review of Systems  Constitutional:  Negative for fever and malaise/fatigue.  HENT:  Negative for congestion, sinus pain and sore throat.   Eyes:  Negative for blurred vision.  Respiratory:  Negative for cough, shortness of breath and wheezing.   Cardiovascular:  Negative for chest pain, palpitations and leg swelling.  Gastrointestinal:  Negative for abdominal pain,  constipation, diarrhea, nausea and vomiting.  Genitourinary:  Negative for dysuria, frequency and hematuria.  Musculoskeletal:  Negative for back pain.       (-)new muscle pain (-)new joint pain  Skin:  Negative for rash.       (-)New moles  Neurological:  Negative for loss of consciousness and headaches.       Objective:    Physical Exam Vitals and nursing note reviewed.  Constitutional:      General: She is not in acute distress.    Appearance: Normal appearance. She is not ill-appearing.  HENT:     Head: Normocephalic and atraumatic.     Right Ear: Tympanic membrane, ear canal and external ear normal.     Left Ear: Tympanic membrane, ear canal and external ear normal.  Eyes:     Extraocular Movements: Extraocular movements intact.     Pupils: Pupils are equal, round, and reactive to light.  Cardiovascular:     Rate and Rhythm: Normal rate and regular rhythm.     Heart sounds: Normal heart sounds. No murmur heard.    No gallop.  Pulmonary:     Effort: Pulmonary effort is normal. No respiratory distress.     Breath sounds: Normal breath sounds. No wheezing or rales.  Abdominal:     General: Bowel sounds are normal. There is no distension.     Palpations: Abdomen is soft.     Tenderness: There is no abdominal tenderness. There is no guarding.  Musculoskeletal:     Cervical back: Normal range of motion.  Lymphadenopathy:     Cervical: No cervical adenopathy.  Skin:    General: Skin is warm and dry.  Neurological:     General: No focal deficit present.     Mental Status: She is alert and oriented to person, place, and time.     Gait: Gait normal.  Psychiatric:        Mood and Affect: Mood normal.        Behavior: Behavior normal.        Thought Content: Thought content normal.        Judgment: Judgment normal.     BP 112/70 (BP Location: Left Arm, Patient Position: Sitting, Cuff Size: Large)   Pulse 71   Temp 97.8 F (36.6 C) (Oral)   Resp 18   Ht 5' 1.25"  (1.556 m)   Wt 196 lb 12.8 oz (89.3 kg)   SpO2 98%   BMI 36.88 kg/m  Wt Readings from Last 3 Encounters:  11/20/21 196 lb 12.8 oz (89.3 kg)  12/21/20 195 lb (88.5 kg)  11/16/20 196 lb 12.8 oz (89.3 kg)    Diabetic  Foot Exam - Simple   No data filed    Lab Results  Component Value Date   WBC 6.8 08/28/2020   HGB 13.2 08/28/2020   HCT 38.8 08/28/2020   PLT 319.0 08/28/2020   GLUCOSE 87 08/28/2020   CHOL 186 11/16/2020   TRIG 85.0 11/16/2020   HDL 59.60 11/16/2020   LDLCALC 109 (H) 11/16/2020   ALT 13 08/28/2020   AST 16 08/28/2020   NA 139 08/28/2020   K 4.4 08/28/2020   CL 102 08/28/2020   CREATININE 1.01 08/28/2020   BUN 16 08/28/2020   CO2 29 08/28/2020   TSH 1.85 11/16/2020    Lab Results  Component Value Date   TSH 1.85 11/16/2020   Lab Results  Component Value Date   WBC 6.8 08/28/2020   HGB 13.2 08/28/2020   HCT 38.8 08/28/2020   MCV 94.2 08/28/2020   PLT 319.0 08/28/2020   Lab Results  Component Value Date   NA 139 08/28/2020   K 4.4 08/28/2020   CO2 29 08/28/2020   GLUCOSE 87 08/28/2020   BUN 16 08/28/2020   CREATININE 1.01 08/28/2020   BILITOT 0.5 08/28/2020   ALKPHOS 76 08/28/2020   AST 16 08/28/2020   ALT 13 08/28/2020   PROT 7.0 08/28/2020   ALBUMIN 4.6 08/28/2020   CALCIUM 9.5 08/28/2020   ANIONGAP 8 02/21/2020   GFR 62.02 08/28/2020   Lab Results  Component Value Date   CHOL 186 11/16/2020   Lab Results  Component Value Date   HDL 59.60 11/16/2020   Lab Results  Component Value Date   LDLCALC 109 (H) 11/16/2020   Lab Results  Component Value Date   TRIG 85.0 11/16/2020   Lab Results  Component Value Date   CHOLHDL 3 11/16/2020   No results found for: "HGBA1C"  Mammogram- Last completed 09/14/2021. Results are normal. Repeat in 1 year.  Dexa- Last completed 04/08/2016. Results are normal.  Colonoscopy- Last completed 06/27/2015. Results showed: - The entire examined colon is normal. - No specimens  collected. Repeat in 10 years.      Assessment & Plan:   Problem List Items Addressed This Visit       Unprioritized   Preventative health care - Primary    Reviewed labs from functional medicine Check lipid  ghm utd      Relevant Orders   Comprehensive metabolic panel   Lipid panel   NMR LipoProf noLp+Graph   Morbid obesity ([place])    Per func med        No orders of the defined types were placed in this encounter.   IAnn Held, DO, personally preformed the services described in this documentation.  All medical record entries made by the scribe were at my direction and in my presence.  I have reviewed the chart and discharge instructions (if applicable) and agree that the record reflects my personal performance and is accurate and complete. 11/20/2021   I,Shehryar Baig,acting as a scribe for Ann Held, DO.,have documented all relevant documentation on the behalf of Ann Held, DO,as directed by  Ann Held, DO while in the presence of Ann Held, DO.   Ann Held, DO

## 2021-11-20 NOTE — Assessment & Plan Note (Signed)
Reviewed labs from functional medicine Check lipid  ghm utd

## 2021-11-20 NOTE — Patient Instructions (Signed)
Preventive Care 40-58 Years Old, Female Preventive care refers to lifestyle choices and visits with your health care provider that can promote health and wellness. Preventive care visits are also called wellness exams. What can I expect for my preventive care visit? Counseling Your health care provider may ask you questions about your: Medical history, including: Past medical problems. Family medical history. Pregnancy history. Current health, including: Menstrual cycle. Method of birth control. Emotional well-being. Home life and relationship well-being. Sexual activity and sexual health. Lifestyle, including: Alcohol, nicotine or tobacco, and drug use. Access to firearms. Diet, exercise, and sleep habits. Work and work environment. Sunscreen use. Safety issues such as seatbelt and bike helmet use. Physical exam Your health care provider will check your: Height and weight. These may be used to calculate your BMI (body mass index). BMI is a measurement that tells if you are at a healthy weight. Waist circumference. This measures the distance around your waistline. This measurement also tells if you are at a healthy weight and may help predict your risk of certain diseases, such as type 2 diabetes and high blood pressure. Heart rate and blood pressure. Body temperature. Skin for abnormal spots. What immunizations do I need?  Vaccines are usually given at various ages, according to a schedule. Your health care provider will recommend vaccines for you based on your age, medical history, and lifestyle or other factors, such as travel or where you work. What tests do I need? Screening Your health care provider may recommend screening tests for certain conditions. This may include: Lipid and cholesterol levels. Diabetes screening. This is done by checking your blood sugar (glucose) after you have not eaten for a while (fasting). Pelvic exam and Pap test. Hepatitis B test. Hepatitis C  test. HIV (human immunodeficiency virus) test. STI (sexually transmitted infection) testing, if you are at risk. Lung cancer screening. Colorectal cancer screening. Mammogram. Talk with your health care provider about when you should start having regular mammograms. This may depend on whether you have a family history of breast cancer. BRCA-related cancer screening. This may be done if you have a family history of breast, ovarian, tubal, or peritoneal cancers. Bone density scan. This is done to screen for osteoporosis. Talk with your health care provider about your test results, treatment options, and if necessary, the need for more tests. Follow these instructions at home: Eating and drinking  Eat a diet that includes fresh fruits and vegetables, whole grains, lean protein, and low-fat dairy products. Take vitamin and mineral supplements as recommended by your health care provider. Do not drink alcohol if: Your health care provider tells you not to drink. You are pregnant, may be pregnant, or are planning to become pregnant. If you drink alcohol: Limit how much you have to 0-1 drink a day. Know how much alcohol is in your drink. In the [place] one drink equals one 12 oz bottle of beer (355 mL), one 5 oz glass of wine (148 mL), or one 1 oz glass of hard liquor (44 mL). Lifestyle Brush your teeth every morning and night with fluoride toothpaste. Floss one time each day. Exercise for at least 30 minutes 5 or more days each week. Do not use any products that contain nicotine or tobacco. These products include cigarettes, chewing tobacco, and vaping devices, such as e-cigarettes. If you need help quitting, ask your health care provider. Do not use drugs. If you are sexually active, practice safe sex. Use a condom or other form of protection to   prevent STIs. If you do not wish to become pregnant, use a form of birth control. If you plan to become pregnant, see your health care provider for a  prepregnancy visit. Take aspirin only as told by your health care provider. Make sure that you understand how much to take and what form to take. Work with your health care provider to find out whether it is safe and beneficial for you to take aspirin daily. Find healthy ways to manage stress, such as: Meditation, yoga, or listening to music. Journaling. Talking to a trusted person. Spending time with friends and family. Minimize exposure to UV radiation to reduce your risk of skin cancer. Safety Always wear your seat belt while driving or riding in a vehicle. Do not drive: If you have been drinking alcohol. Do not ride with someone who has been drinking. When you are tired or distracted. While texting. If you have been using any mind-altering substances or drugs. Wear a helmet and other protective equipment during sports activities. If you have firearms in your house, make sure you follow all gun safety procedures. Seek help if you have been physically or sexually abused. What's next? Visit your health care provider once a year for an annual wellness visit. Ask your health care provider how often you should have your eyes and teeth checked. Stay up to date on all vaccines. This information is not intended to replace advice given to you by your health care provider. Make sure you discuss any questions you have with your health care provider. Document Revised: 08/30/2020 Document Reviewed: 08/30/2020 Elsevier Patient Education  [place].

## 2021-11-20 NOTE — Assessment & Plan Note (Signed)
Per func med

## 2021-11-21 LAB — NMR LIPOPROF NOLP+GRAPH
HDL Particle Number: 33.9 umol/L (ref >=30.5)
LDL Particle Number: 1437 nmol/L — ABNORMAL HIGH (ref <1000)
LDL Size: 21.5 nm (ref >20.5)
LP-IR Score: 29 (ref <=45)
Small LDL Particle Number: 541 nmol/L — ABNORMAL HIGH (ref <=527)

## 2021-11-21 NOTE — Telephone Encounter (Signed)
Noted  

## 2022-01-17 IMAGING — US US PELVIS COMPLETE WITH TRANSVAGINAL
1 series · 14 of 25 positions shown · non-contrast
Comparison: None

CLINICAL DATA: Hot flashes



[Series 1: us pelvis complete with transvaginal · 14 of 36 slices shown]
[im 1/36]
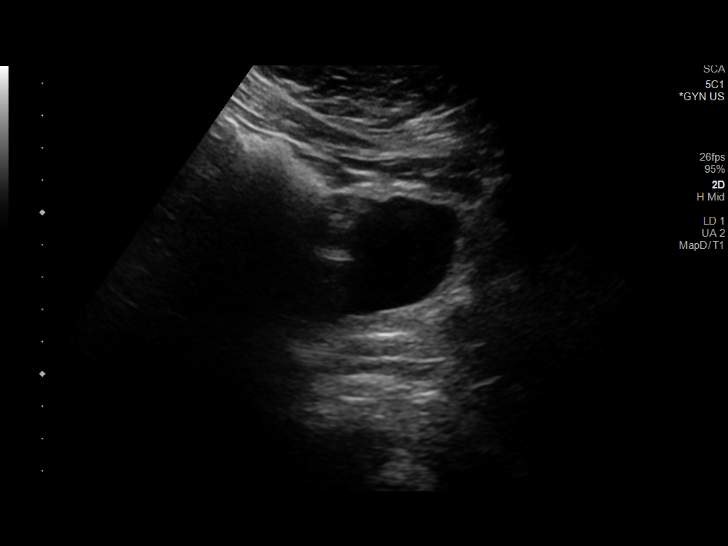
[im 3/36]
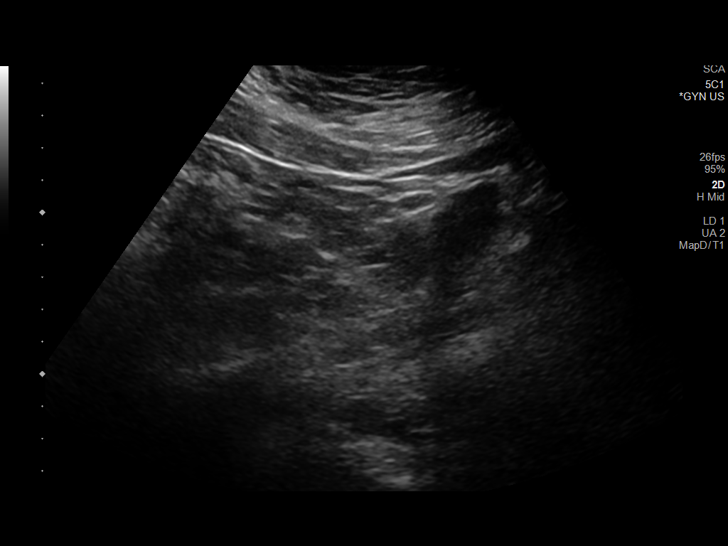
[im 6/36]
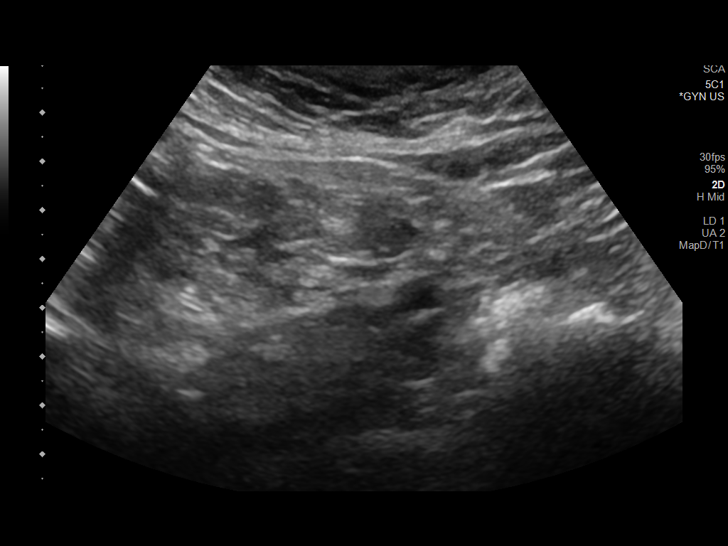
[im 9/36]
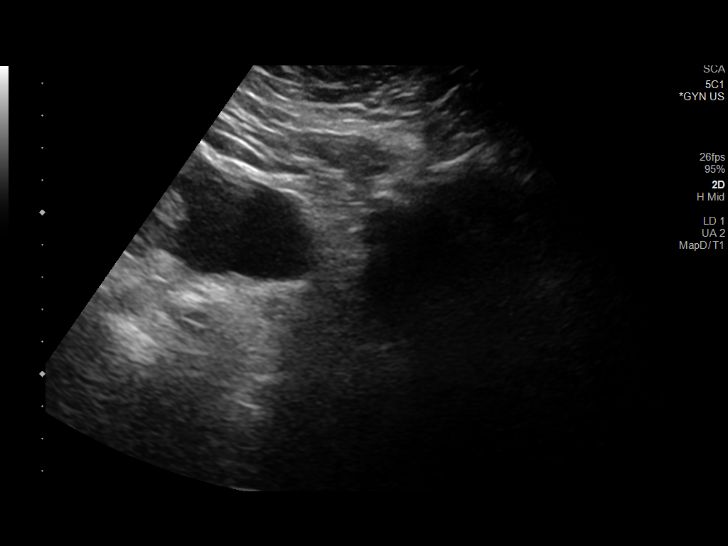
[im 12/36]
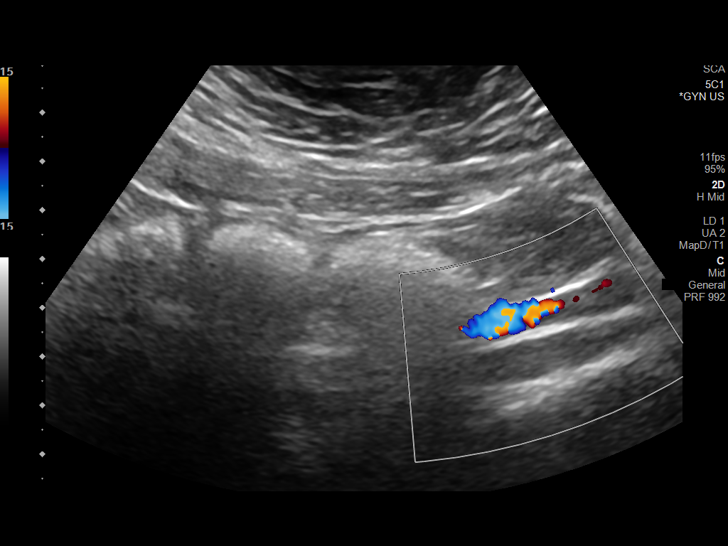
[im 14/36]
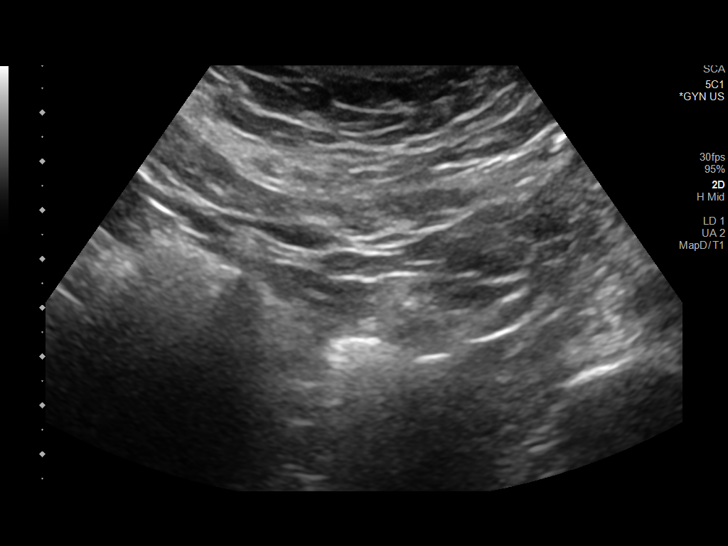
[im 17/36]
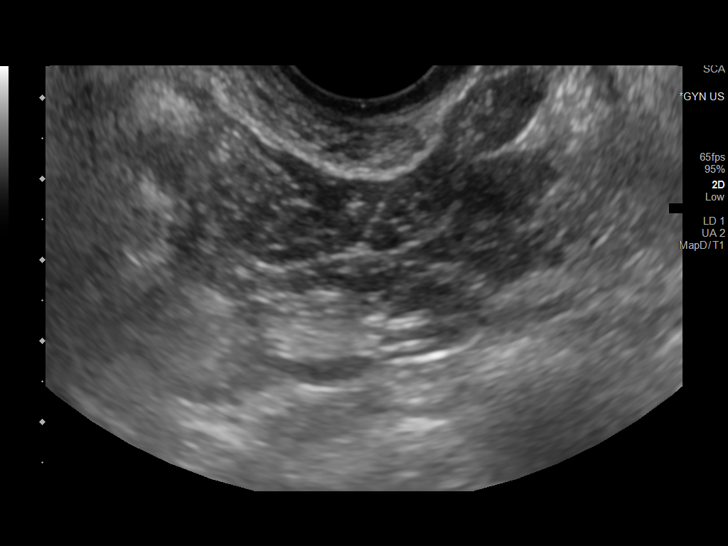
[im 19/36]
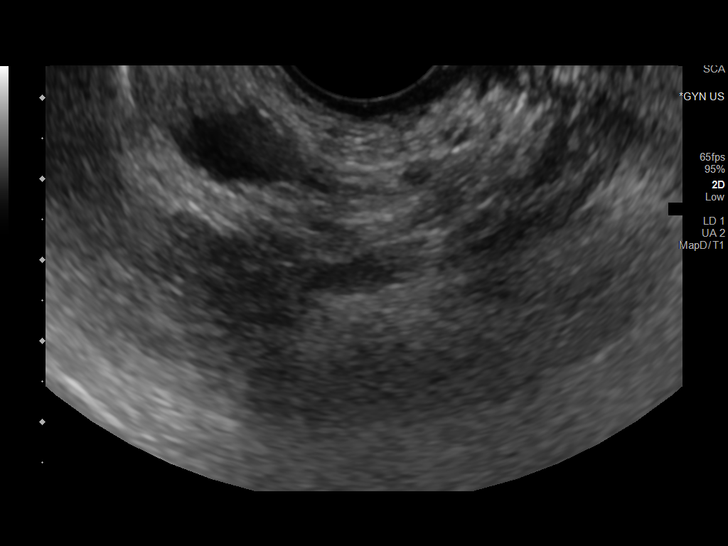
[im 22/36]
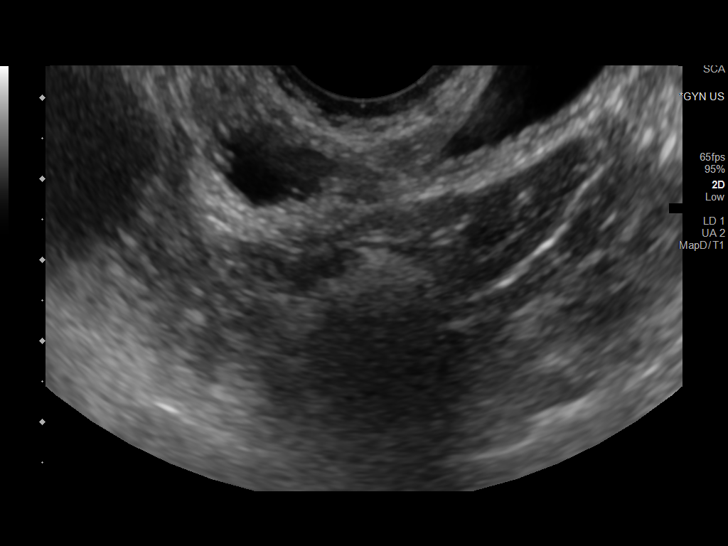
[im 24/36]
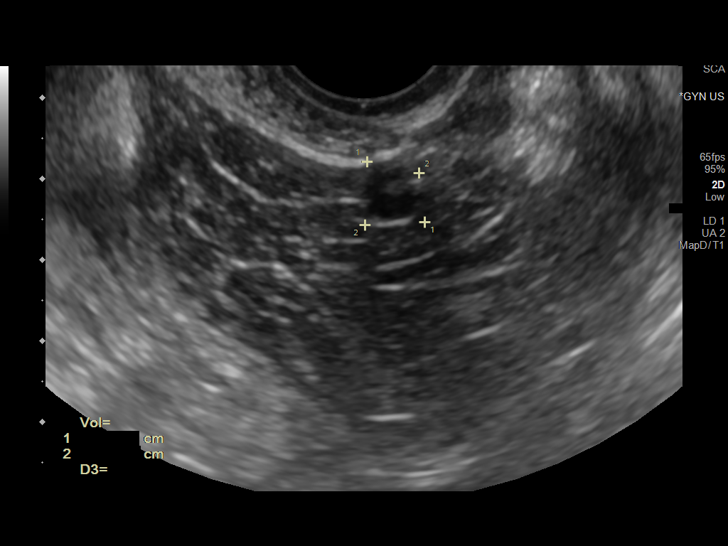
[im 27/36]
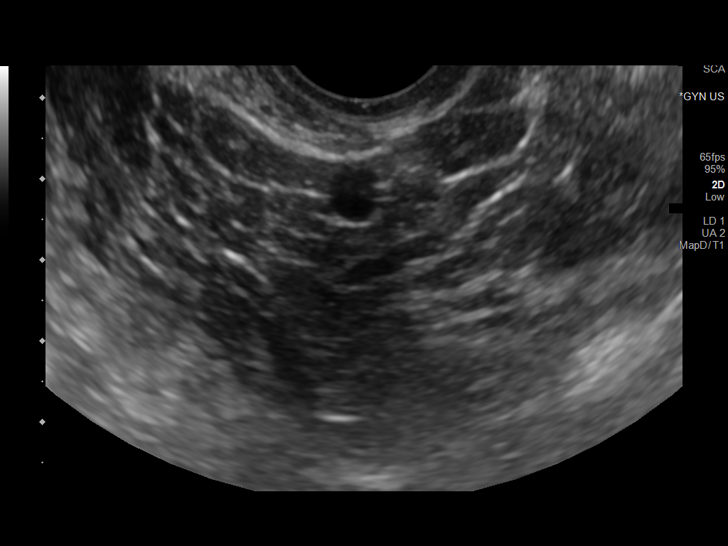
[im 30/36]
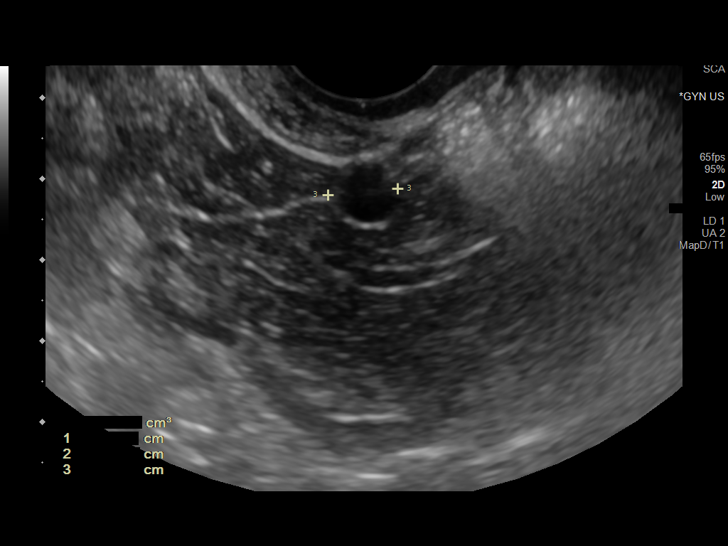
[im 33/36]
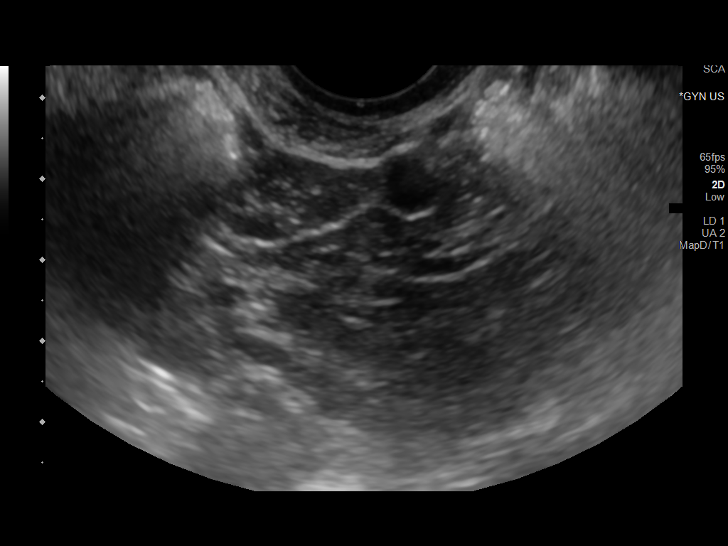
[im 36/36]
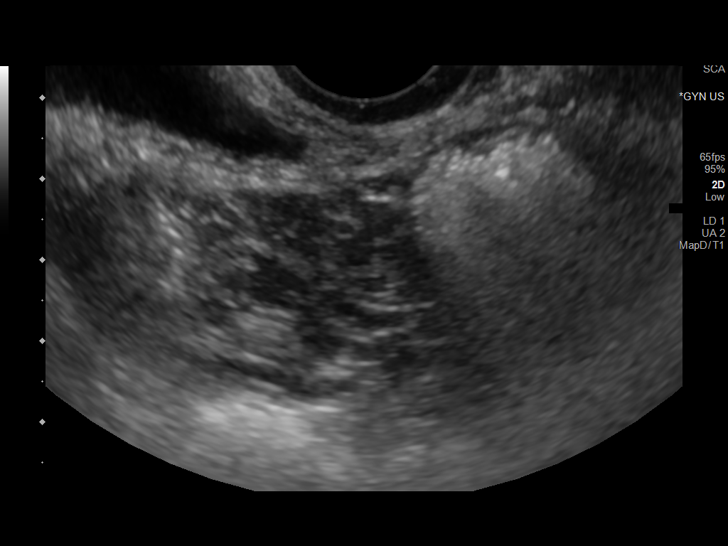

[14 of 25 positions shown; findings below may reference images not displayed]

FINDINGS: Uterus

Status post hysterectomy

Endometrium

Status post hysterectomy

Right ovary

Nonvisualized

Left ovary

Measurements: 1 x 0.9 x 0.9 cm = volume: 0.4 mL. Tiny cyst measuring
1 cm.

Other findings

No abnormal free fluid.
IMPRESSION: 1. Status post hysterectomy.  Nonvisualized right ovary.
2. 1 cm left adnexal cyst. No followup imaging recommended. Note:
This recommendation does not apply to premenarchal patients or to
those with increased risk (genetic, family history, elevated tumor
markers or other high-risk factors) of ovarian cancer. Reference:
Radiology [DATE]):[id number].

## 2022-09-27 LAB — HM MAMMOGRAPHY

## 2022-09-30 ENCOUNTER — Encounter: Payer: Self-pay | Admitting: Family Medicine

## 2022-11-25 ENCOUNTER — Encounter: Payer: 59 | Admitting: Family Medicine

## 2022-12-17 ENCOUNTER — Encounter: Payer: Self-pay | Admitting: Family Medicine

## 2022-12-17 ENCOUNTER — Ambulatory Visit: Payer: 59 | Admitting: Family Medicine

## 2022-12-17 VITALS — BP 126/86 | HR 65 | Temp 97.9°F | Resp 18 | Ht 61.25 in | Wt 200.4 lb

## 2022-12-17 DIAGNOSIS — Z6837 Body mass index (BMI) 37.0-37.9, adult: Secondary | ICD-10-CM

## 2022-12-17 DIAGNOSIS — Z0000 Encounter for general adult medical examination without abnormal findings: Secondary | ICD-10-CM

## 2022-12-17 DIAGNOSIS — Z8249 Family history of ischemic heart disease and other diseases of the circulatory system: Secondary | ICD-10-CM

## 2022-12-17 DIAGNOSIS — Z13220 Encounter for screening for lipoid disorders: Secondary | ICD-10-CM

## 2022-12-17 NOTE — Progress Notes (Signed)
Established Patient Office Visit  Subjective   Patient ID: Julie Gibbs, female    DOB: [birth year]  Age: 57 y.o. MRN: [id number]  Chief Complaint  Patient presents with   Annual Exam    Pt states fasting     HPI Discussed the use of AI scribe software for clinical note transcription with the patient, who gave verbal consent to proceed.  History of Present Illness   The patient, with a history of hormonal imbalance, presents for a routine physical check-up. She reports no significant health issues. She has recently started thyroid medication (NP) and bioidentical hormone replacement pellets, prescribed by a physician's assistant. The patient reports that these treatments have successfully alleviated symptoms such as hot flashes, night sweats, and dryness within a week of starting. She also mentions a slight issue with one knee, which binds up a little when squatting. She expresses a desire for weight loss, but reports that her weight has remained relatively stable over the past year. The patient's family history includes congestive heart failure, which affected both grandmothers and a great-grandmother.      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   Past Surgical History:  Procedure Laterality Date   ABDOMINAL HYSTERECTOMY  07/16/1992   ABDOMINOPLASTY  "About 5 years ago"   [facility]   [facility]/17/2021   [facility]   TONSILLECTOMY  Kindergarten   Social History   Tobacco Use   Smoking status: Never   Smokeless tobacco: Never  Substance  Use Topics   Alcohol use: Yes    Alcohol/week: 1.0 - 2.0 standard drink of alcohol    Types: 1 - 2 Glasses of wine per week   Drug use: No   Social History   Socioeconomic History   Marital status: Married    Spouse name: Not on file   Number of children: Not on file   Years of education: Not on file   Highest education level: Not on file  Occupational History   Not on file  Tobacco Use   Smoking status: Never   Smokeless tobacco: Never  Substance and Sexual Activity   Alcohol use: Yes    Alcohol/week: 1.0 - 2.0 standard drink of alcohol    Types: 1 - 2 Glasses of wine per week   Drug use: No   Sexual activity: Yes    Partners: Male  Other Topics Concern   Not on file  Social History Narrative   Exercise--golfing/ yard work   Occasionally yoga   Social Determinants of Health   Financial Resource Strain: Not on file  Food Insecurity: Not on file  Transportation Needs: Not on file  Physical Activity: Not on file  Stress: Not on file  Social Connections: Not on file  Intimate Partner Violence: Not on file   Family Status  Relation Name Status   Mother  Alive   Father  Deceased at age 61       Neuroendocrine CA   Brother  Alive   MGM  Deceased   MGF  Deceased   PGM  Deceased   PGF  Deceased   Son  Alive   Other MGGM (Not Specified)  No partnership data on file   Family History  Problem Relation Age of Onset   Ataxia Mother    Other Mother        Hereditary Vitamin E Deficiency   Cancer Father        Neuroendocrine CA   Mental retardation Maternal Grandfather    Heart disease Paternal Grandmother    Heart failure Paternal Grandmother    Coronary artery disease Paternal Grandfather    Suicidality Paternal Grandfather    Diabetes Other    Hypertension Other    Breast cancer Other    No Known Allergies    Review of Systems  Constitutional:  Negative for chills, fever and malaise/fatigue.  HENT:  Negative for congestion and hearing loss.   Eyes:   Negative for blurred vision and discharge.  Respiratory:  Negative for cough, sputum production and shortness of breath.   Cardiovascular:  Negative for chest pain, palpitations and leg swelling.  Gastrointestinal:  Negative for abdominal pain, blood in stool, constipation, diarrhea, heartburn, nausea and vomiting.  Genitourinary:  Negative for dysuria, frequency, hematuria and urgency.  Musculoskeletal:  Negative for back pain, falls and myalgias.  Skin:  Negative for rash.  Neurological:  Negative for dizziness, sensory change, loss of consciousness, weakness and headaches.  Endo/Heme/Allergies:  Negative for environmental allergies. Does not bruise/bleed easily.  Psychiatric/Behavioral:  Negative for depression and suicidal ideas. The patient is not nervous/anxious and does not have insomnia.       Objective:     BP 126/86 (BP Location: Left Arm, Patient Position: Sitting, Cuff Size: Large)   Pulse 65   Temp 97.9 F (36.6 C) (Oral)   Resp 18   Ht 5' 1.25" (1.556 m)   Wt 200 lb 6.4 oz (90.9 kg)   SpO2 97%   BMI 37.56 kg/m  BP Readings from Last 3 Encounters:  12/17/22 126/86  11/20/21 112/70  12/21/20 124/60   Wt Readings from Last 3 Encounters:  12/17/22 200 lb 6.4 oz (90.9 kg)  11/20/21 196 lb 12.8 oz (89.3 kg)  12/21/20 195 lb (88.5 kg)   SpO2 Readings from Last 3 Encounters:  12/17/22 97%  11/20/21 98%  12/21/20 97%      Physical Exam Vitals and nursing note reviewed.  Constitutional:      General: She is not in acute distress.    Appearance: Normal appearance. She is well-developed.  HENT:     Head: Normocephalic and atraumatic.     Right Ear: Tympanic membrane, ear canal and external ear normal. There is no impacted cerumen.     Left Ear: Tympanic membrane, ear canal and external ear normal. There is no impacted cerumen.     Nose: Nose normal.     Mouth/Throat:     Mouth: Mucous membranes are moist.     Pharynx: Oropharynx is clear. No oropharyngeal  exudate or posterior oropharyngeal erythema.  Eyes:     General: No scleral icterus.       Right eye: No discharge.        Left eye: No discharge.     Conjunctiva/sclera: Conjunctivae normal.     Pupils: Pupils are equal, round, and reactive to light.  Neck:     Thyroid: No thyromegaly or thyroid tenderness.     Vascular: No JVD.  Cardiovascular:  Rate and Rhythm: Normal rate and regular rhythm.     Heart sounds: Normal heart sounds. No murmur heard. Pulmonary:     Effort: Pulmonary effort is normal. No respiratory distress.     Breath sounds: Normal breath sounds.  Abdominal:     General: Bowel sounds are normal. There is no distension.     Palpations: Abdomen is soft. There is no mass.     Tenderness: There is no abdominal tenderness. There is no guarding or rebound.  Genitourinary:    Vagina: Normal.  Musculoskeletal:        General: Normal range of motion.     Cervical back: Normal range of motion and neck supple.     Right lower leg: No edema.     Left lower leg: No edema.  Lymphadenopathy:     Cervical: No cervical adenopathy.  Skin:    General: Skin is warm and dry.     Findings: No erythema or rash.  Neurological:     Mental Status: She is alert and oriented to person, place, and time.     Cranial Nerves: No cranial nerve deficit.     Deep Tendon Reflexes: Reflexes are normal and symmetric.  Psychiatric:        Mood and Affect: Mood normal.        Behavior: Behavior normal.        Thought Content: Thought content normal.        Judgment: Judgment normal.      No results found for any visits on 12/17/22.  Last CBC Lab Results  Component Value Date   WBC 6.8 08/28/2020   HGB 13.2 08/28/2020   HCT 38.8 08/28/2020   MCV 94.2 08/28/2020   MCH 32.3 02/21/2020   RDW 12.5 08/28/2020   PLT 319.0 08/28/2020   Last metabolic panel Lab Results  Component Value Date   GLUCOSE 94 11/20/2021   NA 140 11/20/2021   K 4.6 11/20/2021   CL 104 11/20/2021   CO2  29 11/20/2021   BUN 13 11/20/2021   CREATININE 0.91 11/20/2021   GFR 69.69 11/20/2021   CALCIUM 9.1 11/20/2021   PROT 6.6 11/20/2021   ALBUMIN 4.2 11/20/2021   BILITOT 0.5 11/20/2021   ALKPHOS 78 11/20/2021   AST 14 11/20/2021   ALT 11 11/20/2021   ANIONGAP 8 02/21/2020   Last lipids Lab Results  Component Value Date   CHOL 175 11/20/2021   HDL 52.10 11/20/2021   LDLCALC 110 (H) 11/20/2021   TRIG 66.0 11/20/2021   CHOLHDL 3 11/20/2021   Last hemoglobin A1c No results found for: "HGBA1C" Last thyroid functions Lab Results  Component Value Date   TSH 1.85 11/16/2020   T4TOTAL 7.0 11/16/2020   Last vitamin D Lab Results  Component Value Date   VD25OH 61.25 08/02/2019   Last vitamin B12 and Folate Lab Results  Component Value Date   VITAMINB12 243 11/16/2020   FOLATE >24.4 11/16/2020      The 10-year ASCVD risk score (Arnett DK, et al., 2019) is: 2.7%    Assessment & Plan:   Problem List Items Addressed This Visit       Unprioritized   Morbid obesity (HCC)   Preventative health care - Primary    [facility]  CHECK LABS  Health Maintenance  Topic Date Due   DTaP/Tdap/Td (1 - Tdap) Never done   COVID-19 Vaccine (1) 01/02/2023 (Originally 09/04/1970)   INFLUENZA VACCINE  06/30/2023 (Originally 10/17/2022)   MAMMOGRAM  09/26/2024  Colonoscopy  06/26/2025   Hepatitis C Screening  Completed   HIV Screening  Completed   Zoster Vaccines- Shingrix  Completed   HPV VACCINES  Aged Out         Relevant Orders   CBC with Differential/Platelet   Comprehensive metabolic panel   Lipid panel   TSH   Apolipoprotein B   Lipoprotein A (LPA)   Other Visit Diagnoses     Family history of early CAD       Relevant Orders   CT CARDIAC MORPH/PULM VEIN W/CM&W/O CA SCORE     Assessment and Plan    Menopause Symptoms of hot flashes, night sweats, and dryness resolved with bioidentical hormone replacement pellets. -Continue current regimen of bioidentical hormone  replacement pellets.  Hypothyroidism On NP thyroid replacement therapy. -Continue current regimen of NP thyroid replacement therapy.  Family History of Congestive Heart Failure Discussed potential risk and options for further assessment. -Order cholesterol, Apo B, and lipoprotein A tests. -Schedule a cardiac calcium score CT scan for further risk assessment.  General Health Maintenance -Continue current exercise regimen and healthy lifestyle habits. -Observe for any changes in weight. -Observe for any changes in joint health. Consider collagen supplement (Nacl) if needed.        Return in about 1 year (around 12/17/2023), or if symptoms worsen or fail to improve.    Donato Schultz, DO

## 2022-12-17 NOTE — Patient Instructions (Signed)
Preventive Care 40-59 Years Old, Female Preventive care refers to lifestyle choices and visits with your health care provider that can promote health and wellness. Preventive care visits are also called wellness exams. What can I expect for my preventive care visit? Counseling Your health care provider may ask you questions about your: Medical history, including: Past medical problems. Family medical history. Pregnancy history. Current health, including: Menstrual cycle. Method of birth control. Emotional well-being. Home life and relationship well-being. Sexual activity and sexual health. Lifestyle, including: Alcohol, nicotine or tobacco, and drug use. Access to firearms. Diet, exercise, and sleep habits. Work and work environment. Sunscreen use. Safety issues such as seatbelt and bike helmet use. Physical exam Your health care provider will check your: Height and weight. These may be used to calculate your BMI (body mass index). BMI is a measurement that tells if you are at a healthy weight. Waist circumference. This measures the distance around your waistline. This measurement also tells if you are at a healthy weight and may help predict your risk of certain diseases, such as type 2 diabetes and high blood pressure. Heart rate and blood pressure. Body temperature. Skin for abnormal spots. What immunizations do I need?  Vaccines are usually given at various ages, according to a schedule. Your health care provider will recommend vaccines for you based on your age, medical history, and lifestyle or other factors, such as travel or where you work. What tests do I need? Screening Your health care provider may recommend screening tests for certain conditions. This may include: Lipid and cholesterol levels. Diabetes screening. This is done by checking your blood sugar (glucose) after you have not eaten for a while (fasting). Pelvic exam and Pap test. Hepatitis B test. Hepatitis C  test. HIV (human immunodeficiency virus) test. STI (sexually transmitted infection) testing, if you are at risk. Lung cancer screening. Colorectal cancer screening. Mammogram. Talk with your health care provider about when you should start having regular mammograms. This may depend on whether you have a family history of breast cancer. BRCA-related cancer screening. This may be done if you have a family history of breast, ovarian, tubal, or peritoneal cancers. Bone density scan. This is done to screen for osteoporosis. Talk with your health care provider about your test results, treatment options, and if necessary, the need for more tests. Follow these instructions at home: Eating and drinking  Eat a diet that includes fresh fruits and vegetables, whole grains, lean protein, and low-fat dairy products. Take vitamin and mineral supplements as recommended by your health care provider. Do not drink alcohol if: Your health care provider tells you not to drink. You are pregnant, may be pregnant, or are planning to become pregnant. If you drink alcohol: Limit how much you have to 0-1 drink a day. Know how much alcohol is in your drink. In the [place] one drink equals one 12 oz bottle of beer (355 mL), one 5 oz glass of wine (148 mL), or one 1 oz glass of hard liquor (44 mL). Lifestyle Brush your teeth every morning and night with fluoride toothpaste. Floss one time each day. Exercise for at least 30 minutes 5 or more days each week. Do not use any products that contain nicotine or tobacco. These products include cigarettes, chewing tobacco, and vaping devices, such as e-cigarettes. If you need help quitting, ask your health care provider. Do not use drugs. If you are sexually active, practice safe sex. Use a condom or other form of protection to   prevent STIs. If you do not wish to become pregnant, use a form of birth control. If you plan to become pregnant, see your health care provider for a  prepregnancy visit. Take aspirin only as told by your health care provider. Make sure that you understand how much to take and what form to take. Work with your health care provider to find out whether it is safe and beneficial for you to take aspirin daily. Find healthy ways to manage stress, such as: Meditation, yoga, or listening to music. Journaling. Talking to a trusted person. Spending time with friends and family. Minimize exposure to UV radiation to reduce your risk of skin cancer. Safety Always wear your seat belt while driving or riding in a vehicle. Do not drive: If you have been drinking alcohol. Do not ride with someone who has been drinking. When you are tired or distracted. While texting. If you have been using any mind-altering substances or drugs. Wear a helmet and other protective equipment during sports activities. If you have firearms in your house, make sure you follow all gun safety procedures. Seek help if you have been physically or sexually abused. What's next? Visit your health care provider once a year for an annual wellness visit. Ask your health care provider how often you should have your eyes and teeth checked. Stay up to date on all vaccines. This information is not intended to replace advice given to you by your health care provider. Make sure you discuss any questions you have with your health care provider. Document Revised: 08/30/2020 Document Reviewed: 08/30/2020 Elsevier Patient Education  2024 [employer]  

## 2022-12-17 NOTE — Assessment & Plan Note (Signed)
[facility]  CHECK LABS  Health Maintenance  Topic Date Due   DTaP/Tdap/Td (1 - Tdap) Never done   COVID-19 Vaccine (1) 01/02/2023 (Originally 09/04/1970)   INFLUENZA VACCINE  06/30/2023 (Originally 10/17/2022)   MAMMOGRAM  09/26/2024   Colonoscopy  06/26/2025   Hepatitis C Screening  Completed   HIV Screening  Completed   Zoster Vaccines- Shingrix  Completed   HPV VACCINES  Aged Out

## 2022-12-18 LAB — CBC WITH DIFFERENTIAL/PLATELET
Basophils Absolute: 0.1 10*3/uL (ref 0.0–0.1)
Basophils Relative: 1.4 % (ref 0.0–3.0)
Eosinophils Absolute: 0.1 10*3/uL (ref 0.0–0.7)
Eosinophils Relative: 2.1 % (ref 0.0–5.0)
HCT: 40.2 % (ref 36.0–46.0)
Hemoglobin: 13.3 g/dL (ref 12.0–15.0)
Lymphocytes Relative: 38.3 % (ref 12.0–46.0)
Lymphs Abs: 2.7 10*3/uL (ref 0.7–4.0)
MCHC: 33.1 g/dL (ref 30.0–36.0)
MCV: 95.4 fL (ref 78.0–100.0)
Monocytes Absolute: 0.5 10*3/uL (ref 0.1–1.0)
Monocytes Relative: 6.7 % (ref 3.0–12.0)
Neutro Abs: 3.6 10*3/uL (ref 1.4–7.7)
Neutrophils Relative %: 51.5 % (ref 43.0–77.0)
Platelets: 384 10*3/uL (ref 150.0–400.0)
RBC: 4.21 Mil/uL (ref 3.87–5.11)
RDW: 13.2 % (ref 11.5–15.5)
WBC: 7 10*3/uL (ref 4.0–10.5)

## 2022-12-18 LAB — APOLIPOPROTEIN B: Apolipoprotein B: 96 mg/dL — ABNORMAL HIGH (ref <90)

## 2022-12-18 LAB — LIPID PANEL
Cholesterol: 189 mg/dL (ref 0–200)
HDL: 48.2 mg/dL (ref >39.00)
LDL Cholesterol: 115 mg/dL — ABNORMAL HIGH (ref 0–99)
NonHDL: 141.05
Total CHOL/HDL Ratio: 4
Triglycerides: 128 mg/dL (ref 0.0–149.0)
VLDL: 25.6 mg/dL (ref 0.0–40.0)

## 2022-12-18 LAB — COMPREHENSIVE METABOLIC PANEL
ALT: 12 U/L (ref 0–35)
AST: 15 U/L (ref 0–37)
Albumin: 4.4 g/dL (ref 3.5–5.2)
Alkaline Phosphatase: 68 U/L (ref 39–117)
BUN: 12 mg/dL (ref 6–23)
CO2: 29 meq/L (ref 19–32)
Calcium: 9 mg/dL (ref 8.4–10.5)
Chloride: 101 meq/L (ref 96–112)
Creatinine, Ser: 0.89 mg/dL (ref 0.40–1.20)
GFR: 71.03 mL/min (ref >60.00)
Glucose, Bld: 93 mg/dL (ref 70–99)
Potassium: 4.4 meq/L (ref 3.5–5.1)
Sodium: 136 meq/L (ref 135–145)
Total Bilirubin: 0.5 mg/dL (ref 0.2–1.2)
Total Protein: 6.6 g/dL (ref 6.0–8.3)

## 2022-12-18 LAB — TSH: TSH: 1.6 u[IU]/mL (ref 0.35–5.50)

## 2022-12-25 ENCOUNTER — Other Ambulatory Visit: Payer: Self-pay | Admitting: Family Medicine

## 2022-12-25 ENCOUNTER — Ambulatory Visit (HOSPITAL_BASED_OUTPATIENT_CLINIC_OR_DEPARTMENT_OTHER)
Admission: RE | Admit: 2022-12-25 | Discharge: 2022-12-25 | Disposition: A | Discharge status: Home / Self Care | Payer: 59 | Source: Ambulatory Visit | Attending: Family Medicine | Admitting: Family Medicine

## 2022-12-25 DIAGNOSIS — Z8249 Family history of ischemic heart disease and other diseases of the circulatory system: Secondary | ICD-10-CM | POA: Insufficient documentation

## 2022-12-25 DIAGNOSIS — E785 Hyperlipidemia, unspecified: Secondary | ICD-10-CM

## 2022-12-25 LAB — LIPOPROTEIN A (LPA): Lipoprotein (a): 199 nmol/L — ABNORMAL HIGH (ref <75)

## 2022-12-27 NOTE — Addendum Note (Signed)
Addended by: Seabron Spates R on: 12/27/2022 03:55 PM   Modules accepted: Level of Service

## 2023-02-19 ENCOUNTER — Other Ambulatory Visit: Payer: Self-pay | Admitting: Family Medicine

## 2023-02-19 ENCOUNTER — Encounter: Payer: Self-pay | Admitting: Family Medicine

## 2023-02-21 ENCOUNTER — Other Ambulatory Visit (INDEPENDENT_AMBULATORY_CARE_PROVIDER_SITE_OTHER): Payer: 59

## 2023-02-21 DIAGNOSIS — E785 Hyperlipidemia, unspecified: Secondary | ICD-10-CM | POA: Diagnosis not present

## 2023-02-21 LAB — COMPREHENSIVE METABOLIC PANEL
ALT: 13 U/L (ref 0–35)
AST: 16 U/L (ref 0–37)
Albumin: 4.5 g/dL (ref 3.5–5.2)
Alkaline Phosphatase: 60 U/L (ref 39–117)
BUN: 13 mg/dL (ref 6–23)
CO2: 29 meq/L (ref 19–32)
Calcium: 8.9 mg/dL (ref 8.4–10.5)
Chloride: 101 meq/L (ref 96–112)
Creatinine, Ser: 0.87 mg/dL (ref 0.40–1.20)
GFR: 72.9 mL/min (ref >60.00)
Glucose, Bld: 90 mg/dL (ref 70–99)
Potassium: 5.2 meq/L — ABNORMAL HIGH (ref 3.5–5.1)
Sodium: 137 meq/L (ref 135–145)
Total Bilirubin: 0.6 mg/dL (ref 0.2–1.2)
Total Protein: 6.8 g/dL (ref 6.0–8.3)

## 2023-02-21 LAB — LIPID PANEL
Cholesterol: 170 mg/dL (ref 0–200)
HDL: 46.7 mg/dL (ref >39.00)
LDL Cholesterol: 108 mg/dL — ABNORMAL HIGH (ref 0–99)
NonHDL: 123.23
Total CHOL/HDL Ratio: 4
Triglycerides: 78 mg/dL (ref 0.0–149.0)
VLDL: 15.6 mg/dL (ref 0.0–40.0)

## 2023-02-21 LAB — VITAMIN D 25 HYDROXY (VIT D DEFICIENCY, FRACTURES): VITD: 45.82 ng/mL (ref 30.00–100.00)

## 2023-02-24 LAB — INSULIN, RANDOM: Insulin: 4.9 u[IU]/mL

## 2023-10-21 LAB — HM MAMMOGRAPHY

## 2023-10-24 ENCOUNTER — Encounter: Payer: Self-pay | Admitting: Family Medicine

## 2023-12-18 ENCOUNTER — Encounter: Payer: 59 | Admitting: Family Medicine
